# Patient Record
Sex: Female | Born: 1940 | Race: White | Hispanic: No | Marital: Married | Smoking: Never smoker
Health system: Southern US, Community
[De-identification: ages and names within clinical notes are randomized; demographics above are authoritative.]

## PROBLEM LIST (undated history)

## (undated) DIAGNOSIS — R911 Solitary pulmonary nodule: Secondary | ICD-10-CM

## (undated) DIAGNOSIS — J45909 Unspecified asthma, uncomplicated: Secondary | ICD-10-CM

## (undated) DIAGNOSIS — I10 Essential (primary) hypertension: Secondary | ICD-10-CM

## (undated) DIAGNOSIS — D509 Iron deficiency anemia, unspecified: Secondary | ICD-10-CM

## (undated) DIAGNOSIS — C801 Malignant (primary) neoplasm, unspecified: Secondary | ICD-10-CM

## (undated) DIAGNOSIS — E119 Type 2 diabetes mellitus without complications: Secondary | ICD-10-CM

## (undated) HISTORY — DX: Iron deficiency anemia, unspecified: D50.9

## (undated) HISTORY — PX: OOPHORECTOMY: SHX86

---

## 2004-05-09 ENCOUNTER — Ambulatory Visit: Payer: Self-pay | Admitting: Internal Medicine

## 2004-05-18 ENCOUNTER — Ambulatory Visit: Payer: Self-pay | Admitting: Internal Medicine

## 2005-05-11 ENCOUNTER — Ambulatory Visit: Payer: Self-pay | Admitting: Internal Medicine

## 2005-05-30 ENCOUNTER — Ambulatory Visit: Payer: Self-pay | Admitting: Internal Medicine

## 2005-12-01 ENCOUNTER — Ambulatory Visit: Payer: Self-pay | Admitting: Internal Medicine

## 2006-05-31 ENCOUNTER — Ambulatory Visit: Payer: Self-pay | Admitting: Internal Medicine

## 2007-06-27 ENCOUNTER — Ambulatory Visit: Payer: Self-pay | Admitting: Internal Medicine

## 2008-07-08 ENCOUNTER — Ambulatory Visit: Payer: Self-pay | Admitting: Internal Medicine

## 2009-07-12 ENCOUNTER — Ambulatory Visit: Payer: Self-pay | Admitting: Internal Medicine

## 2010-05-17 ENCOUNTER — Encounter
Admission: RE | Admit: 2010-05-17 | Discharge: 2010-05-17 | Payer: Self-pay | Source: Home / Self Care | Attending: Orthopedic Surgery | Admitting: Orthopedic Surgery

## 2010-05-18 ENCOUNTER — Encounter
Admission: RE | Admit: 2010-05-18 | Discharge: 2010-05-18 | Payer: Self-pay | Source: Home / Self Care | Attending: Orthopedic Surgery | Admitting: Orthopedic Surgery

## 2010-06-07 ENCOUNTER — Encounter
Admission: RE | Admit: 2010-06-07 | Discharge: 2010-06-07 | Payer: Self-pay | Source: Home / Self Care | Attending: Orthopedic Surgery | Admitting: Orthopedic Surgery

## 2010-07-20 ENCOUNTER — Ambulatory Visit: Payer: Self-pay | Admitting: Internal Medicine

## 2010-09-26 ENCOUNTER — Other Ambulatory Visit: Payer: Self-pay | Admitting: Orthopedic Surgery

## 2010-09-26 DIAGNOSIS — M79606 Pain in leg, unspecified: Secondary | ICD-10-CM

## 2010-09-28 ENCOUNTER — Ambulatory Visit
Admission: RE | Admit: 2010-09-28 | Discharge: 2010-09-28 | Disposition: A | Discharge status: Home / Self Care | Payer: Medicare Other | Source: Ambulatory Visit | Attending: Orthopedic Surgery | Admitting: Orthopedic Surgery

## 2010-09-28 DIAGNOSIS — M79606 Pain in leg, unspecified: Secondary | ICD-10-CM

## 2010-12-05 ENCOUNTER — Other Ambulatory Visit: Payer: Self-pay | Admitting: Physician Assistant

## 2010-12-05 DIAGNOSIS — M4800 Spinal stenosis, site unspecified: Secondary | ICD-10-CM

## 2010-12-06 ENCOUNTER — Ambulatory Visit
Admission: RE | Admit: 2010-12-06 | Discharge: 2010-12-06 | Disposition: A | Discharge status: Home / Self Care | Payer: Medicare Other | Source: Ambulatory Visit | Attending: Physician Assistant | Admitting: Physician Assistant

## 2010-12-06 DIAGNOSIS — M4800 Spinal stenosis, site unspecified: Secondary | ICD-10-CM

## 2011-01-23 ENCOUNTER — Other Ambulatory Visit: Payer: Self-pay | Admitting: Physician Assistant

## 2011-01-23 DIAGNOSIS — M549 Dorsalgia, unspecified: Secondary | ICD-10-CM

## 2011-01-24 ENCOUNTER — Ambulatory Visit
Admission: RE | Admit: 2011-01-24 | Discharge: 2011-01-24 | Disposition: A | Discharge status: Home / Self Care | Payer: Medicare Other | Source: Ambulatory Visit | Attending: Physician Assistant | Admitting: Physician Assistant

## 2011-01-24 DIAGNOSIS — M549 Dorsalgia, unspecified: Secondary | ICD-10-CM

## 2011-01-24 MED ORDER — IOHEXOL 180 MG/ML  SOLN
1.0000 mL | Freq: Once | INTRAMUSCULAR | Status: AC | PRN
  Administered 2011-01-24: 1 mL via EPIDURAL

## 2011-01-24 MED ORDER — METHYLPREDNISOLONE ACETATE 40 MG/ML INJ SUSP (RADIOLOG
120.0000 mg | Freq: Once | INTRAMUSCULAR | Status: AC
  Administered 2011-01-24: 120 mg via EPIDURAL

## 2011-03-31 ENCOUNTER — Other Ambulatory Visit: Payer: Self-pay | Admitting: Physician Assistant

## 2011-03-31 DIAGNOSIS — M549 Dorsalgia, unspecified: Secondary | ICD-10-CM

## 2011-04-03 ENCOUNTER — Ambulatory Visit
Admission: RE | Admit: 2011-04-03 | Discharge: 2011-04-03 | Disposition: A | Discharge status: Home / Self Care | Payer: Medicare Other | Source: Ambulatory Visit | Attending: Physician Assistant | Admitting: Physician Assistant

## 2011-04-03 DIAGNOSIS — M549 Dorsalgia, unspecified: Secondary | ICD-10-CM

## 2011-04-03 MED ORDER — IOHEXOL 180 MG/ML  SOLN
1.0000 mL | Freq: Once | INTRAMUSCULAR | Status: AC | PRN
  Administered 2011-04-03: 1 mL via EPIDURAL

## 2011-04-03 MED ORDER — METHYLPREDNISOLONE ACETATE 40 MG/ML INJ SUSP (RADIOLOG
120.0000 mg | Freq: Once | INTRAMUSCULAR | Status: AC
  Administered 2011-04-03: 120 mg via EPIDURAL

## 2011-06-23 ENCOUNTER — Other Ambulatory Visit: Payer: Self-pay | Admitting: Physician Assistant

## 2011-06-23 DIAGNOSIS — M4800 Spinal stenosis, site unspecified: Secondary | ICD-10-CM

## 2011-07-06 ENCOUNTER — Ambulatory Visit
Admission: RE | Admit: 2011-07-06 | Discharge: 2011-07-06 | Disposition: A | Discharge status: Home / Self Care | Payer: Medicare Other | Source: Ambulatory Visit | Attending: Physician Assistant | Admitting: Physician Assistant

## 2011-07-06 DIAGNOSIS — M4800 Spinal stenosis, site unspecified: Secondary | ICD-10-CM

## 2011-07-24 ENCOUNTER — Ambulatory Visit: Payer: Self-pay | Admitting: Internal Medicine

## 2011-09-04 ENCOUNTER — Other Ambulatory Visit: Payer: Self-pay | Admitting: Physician Assistant

## 2011-09-04 DIAGNOSIS — M4800 Spinal stenosis, site unspecified: Secondary | ICD-10-CM

## 2011-09-12 ENCOUNTER — Ambulatory Visit
Admission: RE | Admit: 2011-09-12 | Discharge: 2011-09-12 | Disposition: A | Discharge status: Home / Self Care | Payer: Medicare Other | Source: Ambulatory Visit | Attending: Physician Assistant | Admitting: Physician Assistant

## 2011-09-12 VITALS — BP 119/61 | HR 104

## 2011-09-12 DIAGNOSIS — M4800 Spinal stenosis, site unspecified: Secondary | ICD-10-CM

## 2011-09-12 MED ORDER — METHYLPREDNISOLONE ACETATE 40 MG/ML INJ SUSP (RADIOLOG
120.0000 mg | Freq: Once | INTRAMUSCULAR | Status: AC
  Administered 2011-09-12: 120 mg via EPIDURAL

## 2011-09-12 MED ORDER — IOHEXOL 180 MG/ML  SOLN
1.0000 mL | Freq: Once | INTRAMUSCULAR | Status: AC | PRN
  Administered 2011-09-12: 1 mL via EPIDURAL

## 2011-10-30 ENCOUNTER — Other Ambulatory Visit: Payer: Self-pay | Admitting: Physician Assistant

## 2011-10-30 DIAGNOSIS — M4800 Spinal stenosis, site unspecified: Secondary | ICD-10-CM

## 2011-11-13 ENCOUNTER — Ambulatory Visit
Admission: RE | Admit: 2011-11-13 | Discharge: 2011-11-13 | Disposition: A | Discharge status: Home / Self Care | Payer: Medicare Other | Source: Ambulatory Visit | Attending: Physician Assistant | Admitting: Physician Assistant

## 2011-11-13 DIAGNOSIS — M4800 Spinal stenosis, site unspecified: Secondary | ICD-10-CM

## 2011-11-13 MED ORDER — METHYLPREDNISOLONE ACETATE 40 MG/ML INJ SUSP (RADIOLOG
120.0000 mg | Freq: Once | INTRAMUSCULAR | Status: AC
  Administered 2011-11-13: 120 mg via EPIDURAL

## 2011-11-13 MED ORDER — IOHEXOL 180 MG/ML  SOLN
1.0000 mL | Freq: Once | INTRAMUSCULAR | Status: AC | PRN
  Administered 2011-11-13: 1 mL via EPIDURAL

## 2011-12-20 ENCOUNTER — Other Ambulatory Visit: Payer: Self-pay | Admitting: Physician Assistant

## 2011-12-20 DIAGNOSIS — M4800 Spinal stenosis, site unspecified: Secondary | ICD-10-CM

## 2012-01-23 ENCOUNTER — Ambulatory Visit
Admission: RE | Admit: 2012-01-23 | Discharge: 2012-01-23 | Disposition: A | Discharge status: Home / Self Care | Payer: Medicare Other | Source: Ambulatory Visit | Attending: Physician Assistant | Admitting: Physician Assistant

## 2012-01-23 VITALS — BP 154/74 | HR 82

## 2012-01-23 DIAGNOSIS — M4800 Spinal stenosis, site unspecified: Secondary | ICD-10-CM

## 2012-01-23 MED ORDER — METHYLPREDNISOLONE ACETATE 40 MG/ML INJ SUSP (RADIOLOG
120.0000 mg | Freq: Once | INTRAMUSCULAR | Status: AC
  Administered 2012-01-23: 120 mg via EPIDURAL

## 2012-01-23 MED ORDER — IOHEXOL 180 MG/ML  SOLN
1.0000 mL | Freq: Once | INTRAMUSCULAR | Status: AC | PRN
  Administered 2012-01-23: 1 mL via EPIDURAL

## 2012-01-24 ENCOUNTER — Telehealth: Payer: Self-pay | Admitting: Radiology

## 2012-04-30 ENCOUNTER — Other Ambulatory Visit: Payer: Self-pay | Admitting: Physician Assistant

## 2012-04-30 DIAGNOSIS — M549 Dorsalgia, unspecified: Secondary | ICD-10-CM

## 2012-05-01 ENCOUNTER — Ambulatory Visit
Admission: RE | Admit: 2012-05-01 | Discharge: 2012-05-01 | Disposition: A | Discharge status: Home / Self Care | Payer: Medicare Other | Source: Ambulatory Visit | Attending: Physician Assistant | Admitting: Physician Assistant

## 2012-05-01 VITALS — BP 190/75 | HR 70

## 2012-05-01 DIAGNOSIS — M549 Dorsalgia, unspecified: Secondary | ICD-10-CM

## 2012-05-01 MED ORDER — METHYLPREDNISOLONE ACETATE 40 MG/ML INJ SUSP (RADIOLOG
120.0000 mg | Freq: Once | INTRAMUSCULAR | Status: AC
  Administered 2012-05-01: 120 mg via EPIDURAL

## 2012-05-01 MED ORDER — IOHEXOL 180 MG/ML  SOLN
1.0000 mL | Freq: Once | INTRAMUSCULAR | Status: AC | PRN
  Administered 2012-05-01: 1 mL via EPIDURAL

## 2012-07-24 ENCOUNTER — Ambulatory Visit: Payer: Self-pay | Admitting: Internal Medicine

## 2012-08-14 ENCOUNTER — Other Ambulatory Visit: Payer: Self-pay | Admitting: Physician Assistant

## 2012-08-14 DIAGNOSIS — M4800 Spinal stenosis, site unspecified: Secondary | ICD-10-CM

## 2012-08-16 ENCOUNTER — Ambulatory Visit
Admission: RE | Admit: 2012-08-16 | Discharge: 2012-08-16 | Disposition: A | Discharge status: Home / Self Care | Payer: 59 | Source: Ambulatory Visit | Attending: Physician Assistant | Admitting: Physician Assistant

## 2012-08-16 DIAGNOSIS — M4800 Spinal stenosis, site unspecified: Secondary | ICD-10-CM

## 2012-08-16 MED ORDER — IOHEXOL 180 MG/ML  SOLN
1.0000 mL | Freq: Once | INTRAMUSCULAR | Status: AC | PRN
  Administered 2012-08-16: 1 mL via EPIDURAL

## 2012-08-16 MED ORDER — METHYLPREDNISOLONE ACETATE 40 MG/ML INJ SUSP (RADIOLOG
120.0000 mg | Freq: Once | INTRAMUSCULAR | Status: AC
  Administered 2012-08-16: 120 mg via EPIDURAL

## 2012-10-16 ENCOUNTER — Other Ambulatory Visit: Payer: Self-pay | Admitting: Physician Assistant

## 2012-10-16 DIAGNOSIS — M549 Dorsalgia, unspecified: Secondary | ICD-10-CM

## 2012-11-05 ENCOUNTER — Other Ambulatory Visit: Payer: 59

## 2012-11-05 ENCOUNTER — Ambulatory Visit
Admission: RE | Admit: 2012-11-05 | Discharge: 2012-11-05 | Disposition: A | Discharge status: Home / Self Care | Payer: 59 | Source: Ambulatory Visit | Attending: Physician Assistant | Admitting: Physician Assistant

## 2012-11-05 VITALS — BP 152/73 | HR 87

## 2012-11-05 DIAGNOSIS — M549 Dorsalgia, unspecified: Secondary | ICD-10-CM

## 2012-11-05 MED ORDER — IOHEXOL 180 MG/ML  SOLN
1.0000 mL | Freq: Once | INTRAMUSCULAR | Status: AC | PRN
  Administered 2012-11-05: 1 mL via EPIDURAL

## 2012-11-05 MED ORDER — METHYLPREDNISOLONE ACETATE 40 MG/ML INJ SUSP (RADIOLOG
120.0000 mg | Freq: Once | INTRAMUSCULAR | Status: AC
  Administered 2012-11-05: 120 mg via EPIDURAL

## 2013-01-24 ENCOUNTER — Other Ambulatory Visit: Payer: Self-pay | Admitting: Physician Assistant

## 2013-01-24 DIAGNOSIS — M4800 Spinal stenosis, site unspecified: Secondary | ICD-10-CM

## 2013-02-04 ENCOUNTER — Ambulatory Visit
Admission: RE | Admit: 2013-02-04 | Discharge: 2013-02-04 | Disposition: A | Discharge status: Home / Self Care | Payer: 59 | Source: Ambulatory Visit | Attending: Physician Assistant | Admitting: Physician Assistant

## 2013-02-04 VITALS — BP 186/69 | HR 99

## 2013-02-04 DIAGNOSIS — M4800 Spinal stenosis, site unspecified: Secondary | ICD-10-CM

## 2013-02-04 MED ORDER — METHYLPREDNISOLONE ACETATE 40 MG/ML INJ SUSP (RADIOLOG
120.0000 mg | Freq: Once | INTRAMUSCULAR | Status: AC
  Administered 2013-02-04: 120 mg via EPIDURAL

## 2013-02-04 MED ORDER — IOHEXOL 180 MG/ML  SOLN
1.0000 mL | Freq: Once | INTRAMUSCULAR | Status: AC | PRN
  Administered 2013-02-04: 1 mL via EPIDURAL

## 2013-03-31 ENCOUNTER — Other Ambulatory Visit: Payer: Self-pay | Admitting: Physician Assistant

## 2013-03-31 DIAGNOSIS — M549 Dorsalgia, unspecified: Secondary | ICD-10-CM

## 2013-04-08 ENCOUNTER — Ambulatory Visit
Admission: RE | Admit: 2013-04-08 | Discharge: 2013-04-08 | Disposition: A | Discharge status: Home / Self Care | Payer: 59 | Source: Ambulatory Visit | Attending: Physician Assistant | Admitting: Physician Assistant

## 2013-04-08 DIAGNOSIS — M549 Dorsalgia, unspecified: Secondary | ICD-10-CM

## 2013-04-08 MED ORDER — METHYLPREDNISOLONE ACETATE 40 MG/ML INJ SUSP (RADIOLOG
120.0000 mg | Freq: Once | INTRAMUSCULAR | Status: AC
  Administered 2013-04-08: 120 mg via EPIDURAL

## 2013-04-08 MED ORDER — IOHEXOL 180 MG/ML  SOLN
1.0000 mL | Freq: Once | INTRAMUSCULAR | Status: AC | PRN
  Administered 2013-04-08: 1 mL via EPIDURAL

## 2013-05-26 ENCOUNTER — Other Ambulatory Visit: Payer: Self-pay | Admitting: Physician Assistant

## 2013-05-26 DIAGNOSIS — M549 Dorsalgia, unspecified: Secondary | ICD-10-CM

## 2013-05-27 ENCOUNTER — Ambulatory Visit
Admission: RE | Admit: 2013-05-27 | Discharge: 2013-05-27 | Disposition: A | Discharge status: Home / Self Care | Payer: 59 | Source: Ambulatory Visit | Attending: Physician Assistant | Admitting: Physician Assistant

## 2013-05-27 DIAGNOSIS — M549 Dorsalgia, unspecified: Secondary | ICD-10-CM

## 2013-05-27 MED ORDER — IOHEXOL 180 MG/ML  SOLN
1.0000 mL | Freq: Once | INTRAMUSCULAR | Status: AC | PRN
  Administered 2013-05-27: 1 mL via EPIDURAL

## 2013-05-27 MED ORDER — METHYLPREDNISOLONE ACETATE 40 MG/ML INJ SUSP (RADIOLOG
120.0000 mg | Freq: Once | INTRAMUSCULAR | Status: AC
  Administered 2013-05-27: 120 mg via EPIDURAL

## 2013-08-04 ENCOUNTER — Ambulatory Visit: Payer: Self-pay | Admitting: Internal Medicine

## 2013-08-11 ENCOUNTER — Ambulatory Visit: Payer: Self-pay | Admitting: Internal Medicine

## 2013-08-18 ENCOUNTER — Other Ambulatory Visit: Payer: Self-pay | Admitting: Internal Medicine

## 2013-08-18 DIAGNOSIS — M549 Dorsalgia, unspecified: Secondary | ICD-10-CM

## 2013-08-25 ENCOUNTER — Ambulatory Visit
Admission: RE | Admit: 2013-08-25 | Discharge: 2013-08-25 | Disposition: A | Discharge status: Home / Self Care | Payer: 59 | Source: Ambulatory Visit | Attending: Internal Medicine | Admitting: Internal Medicine

## 2013-08-25 DIAGNOSIS — M549 Dorsalgia, unspecified: Secondary | ICD-10-CM

## 2013-08-25 MED ORDER — IOHEXOL 180 MG/ML  SOLN
1.0000 mL | Freq: Once | INTRAMUSCULAR | Status: AC | PRN
  Administered 2013-08-25: 1 mL via EPIDURAL

## 2013-08-25 MED ORDER — METHYLPREDNISOLONE ACETATE 40 MG/ML INJ SUSP (RADIOLOG
120.0000 mg | Freq: Once | INTRAMUSCULAR | Status: AC
  Administered 2013-08-25: 120 mg via EPIDURAL

## 2013-10-13 ENCOUNTER — Other Ambulatory Visit: Payer: Self-pay | Admitting: Internal Medicine

## 2013-10-13 DIAGNOSIS — M545 Low back pain, unspecified: Secondary | ICD-10-CM

## 2013-10-28 ENCOUNTER — Ambulatory Visit
Admission: RE | Admit: 2013-10-28 | Discharge: 2013-10-28 | Disposition: A | Discharge status: Home / Self Care | Payer: 59 | Source: Ambulatory Visit | Attending: Internal Medicine | Admitting: Internal Medicine

## 2013-10-28 DIAGNOSIS — M545 Low back pain, unspecified: Secondary | ICD-10-CM

## 2013-10-28 MED ORDER — IOHEXOL 180 MG/ML  SOLN
1.0000 mL | Freq: Once | INTRAMUSCULAR | Status: AC | PRN
Start: 2013-10-28 — End: 2013-10-28
  Administered 2013-10-28: 1 mL via EPIDURAL

## 2013-10-28 MED ORDER — METHYLPREDNISOLONE ACETATE 40 MG/ML INJ SUSP (RADIOLOG
120.0000 mg | Freq: Once | INTRAMUSCULAR | Status: AC
  Administered 2013-10-28: 120 mg via EPIDURAL

## 2013-12-31 ENCOUNTER — Other Ambulatory Visit: Payer: Self-pay | Admitting: Internal Medicine

## 2013-12-31 DIAGNOSIS — E119 Type 2 diabetes mellitus without complications: Secondary | ICD-10-CM | POA: Insufficient documentation

## 2013-12-31 DIAGNOSIS — M545 Low back pain, unspecified: Secondary | ICD-10-CM

## 2013-12-31 DIAGNOSIS — J45909 Unspecified asthma, uncomplicated: Secondary | ICD-10-CM | POA: Insufficient documentation

## 2013-12-31 DIAGNOSIS — M4800 Spinal stenosis, site unspecified: Secondary | ICD-10-CM | POA: Insufficient documentation

## 2013-12-31 DIAGNOSIS — E785 Hyperlipidemia, unspecified: Secondary | ICD-10-CM | POA: Insufficient documentation

## 2013-12-31 DIAGNOSIS — E669 Obesity, unspecified: Secondary | ICD-10-CM | POA: Insufficient documentation

## 2013-12-31 DIAGNOSIS — I10 Essential (primary) hypertension: Secondary | ICD-10-CM | POA: Insufficient documentation

## 2014-01-09 ENCOUNTER — Ambulatory Visit
Admission: RE | Admit: 2014-01-09 | Discharge: 2014-01-09 | Disposition: A | Discharge status: Home / Self Care | Payer: 59 | Source: Ambulatory Visit | Attending: Internal Medicine | Admitting: Internal Medicine

## 2014-01-09 DIAGNOSIS — M545 Low back pain, unspecified: Secondary | ICD-10-CM

## 2014-01-12 ENCOUNTER — Other Ambulatory Visit: Payer: Self-pay | Admitting: Internal Medicine

## 2014-01-12 DIAGNOSIS — G8929 Other chronic pain: Secondary | ICD-10-CM

## 2014-01-12 DIAGNOSIS — M545 Low back pain: Principal | ICD-10-CM

## 2014-01-21 ENCOUNTER — Ambulatory Visit
Admission: RE | Admit: 2014-01-21 | Discharge: 2014-01-21 | Disposition: A | Discharge status: Home / Self Care | Payer: 59 | Source: Ambulatory Visit | Attending: Internal Medicine | Admitting: Internal Medicine

## 2014-01-21 DIAGNOSIS — G8929 Other chronic pain: Secondary | ICD-10-CM

## 2014-01-21 DIAGNOSIS — M545 Low back pain: Principal | ICD-10-CM

## 2014-01-21 MED ORDER — METHYLPREDNISOLONE ACETATE 40 MG/ML INJ SUSP (RADIOLOG
120.0000 mg | Freq: Once | INTRAMUSCULAR | Status: AC
  Administered 2014-01-21: 120 mg via EPIDURAL

## 2014-01-21 MED ORDER — IOHEXOL 180 MG/ML  SOLN
1.0000 mL | Freq: Once | INTRAMUSCULAR | Status: AC | PRN
  Administered 2014-01-21: 1 mL via EPIDURAL

## 2014-03-12 ENCOUNTER — Other Ambulatory Visit: Payer: Self-pay | Admitting: Internal Medicine

## 2014-03-12 DIAGNOSIS — G8929 Other chronic pain: Secondary | ICD-10-CM

## 2014-03-12 DIAGNOSIS — M545 Low back pain: Principal | ICD-10-CM

## 2014-03-25 ENCOUNTER — Ambulatory Visit
Admission: RE | Admit: 2014-03-25 | Discharge: 2014-03-25 | Disposition: A | Discharge status: Home / Self Care | Payer: 59 | Source: Ambulatory Visit | Attending: Internal Medicine | Admitting: Internal Medicine

## 2014-03-25 DIAGNOSIS — G8929 Other chronic pain: Secondary | ICD-10-CM

## 2014-03-25 DIAGNOSIS — M545 Low back pain, unspecified: Secondary | ICD-10-CM

## 2014-03-25 MED ORDER — METHYLPREDNISOLONE ACETATE 40 MG/ML INJ SUSP (RADIOLOG
120.0000 mg | Freq: Once | INTRAMUSCULAR | Status: AC
  Administered 2014-03-25: 120 mg via EPIDURAL

## 2014-03-25 MED ORDER — IOHEXOL 180 MG/ML  SOLN
1.0000 mL | Freq: Once | INTRAMUSCULAR | Status: AC | PRN
  Administered 2014-03-25: 1 mL via EPIDURAL

## 2014-06-01 ENCOUNTER — Other Ambulatory Visit: Payer: Self-pay | Admitting: Internal Medicine

## 2014-06-01 DIAGNOSIS — G8929 Other chronic pain: Secondary | ICD-10-CM

## 2014-06-01 DIAGNOSIS — M545 Low back pain, unspecified: Secondary | ICD-10-CM

## 2014-06-08 ENCOUNTER — Other Ambulatory Visit: Payer: Self-pay

## 2014-06-12 ENCOUNTER — Ambulatory Visit
Admission: RE | Admit: 2014-06-12 | Discharge: 2014-06-12 | Disposition: A | Discharge status: Home / Self Care | Payer: Medicare PPO | Source: Ambulatory Visit | Attending: Internal Medicine | Admitting: Internal Medicine

## 2014-06-12 DIAGNOSIS — M545 Low back pain: Principal | ICD-10-CM

## 2014-06-12 DIAGNOSIS — G8929 Other chronic pain: Secondary | ICD-10-CM

## 2014-06-12 MED ORDER — IOHEXOL 180 MG/ML  SOLN
1.0000 mL | Freq: Once | INTRAMUSCULAR | Status: AC | PRN
  Administered 2014-06-12: 1 mL via EPIDURAL

## 2014-06-12 MED ORDER — METHYLPREDNISOLONE ACETATE 40 MG/ML INJ SUSP (RADIOLOG
120.0000 mg | Freq: Once | INTRAMUSCULAR | Status: AC
  Administered 2014-06-12: 120 mg via EPIDURAL

## 2014-08-18 ENCOUNTER — Other Ambulatory Visit: Payer: Self-pay | Admitting: Internal Medicine

## 2014-08-18 DIAGNOSIS — M545 Low back pain: Principal | ICD-10-CM

## 2014-08-18 DIAGNOSIS — G8929 Other chronic pain: Secondary | ICD-10-CM

## 2014-09-01 ENCOUNTER — Ambulatory Visit
Admission: RE | Admit: 2014-09-01 | Discharge: 2014-09-01 | Disposition: A | Discharge status: Home / Self Care | Payer: Medicare PPO | Source: Ambulatory Visit | Attending: Internal Medicine | Admitting: Internal Medicine

## 2014-09-01 DIAGNOSIS — M545 Low back pain, unspecified: Secondary | ICD-10-CM

## 2014-09-01 DIAGNOSIS — G8929 Other chronic pain: Secondary | ICD-10-CM

## 2014-09-01 MED ORDER — METHYLPREDNISOLONE ACETATE 40 MG/ML INJ SUSP (RADIOLOG
120.0000 mg | Freq: Once | INTRAMUSCULAR | Status: AC
  Administered 2014-09-01: 120 mg via EPIDURAL

## 2014-09-01 MED ORDER — IOHEXOL 180 MG/ML  SOLN
1.0000 mL | Freq: Once | INTRAMUSCULAR | Status: AC | PRN
  Administered 2014-09-01: 1 mL via EPIDURAL

## 2014-10-12 ENCOUNTER — Other Ambulatory Visit: Payer: Self-pay | Admitting: Internal Medicine

## 2014-10-12 DIAGNOSIS — G8929 Other chronic pain: Secondary | ICD-10-CM

## 2014-10-12 DIAGNOSIS — M545 Low back pain: Principal | ICD-10-CM

## 2014-10-29 ENCOUNTER — Ambulatory Visit
Admission: RE | Admit: 2014-10-29 | Discharge: 2014-10-29 | Disposition: A | Discharge status: Home / Self Care | Payer: Medicare PPO | Source: Ambulatory Visit | Attending: Internal Medicine | Admitting: Internal Medicine

## 2014-10-29 DIAGNOSIS — G8929 Other chronic pain: Secondary | ICD-10-CM

## 2014-10-29 DIAGNOSIS — M545 Low back pain: Principal | ICD-10-CM

## 2014-10-29 MED ORDER — IOHEXOL 180 MG/ML  SOLN
1.0000 mL | Freq: Once | INTRAMUSCULAR | Status: AC | PRN
  Administered 2014-10-29: 1 mL via EPIDURAL

## 2014-10-29 MED ORDER — METHYLPREDNISOLONE ACETATE 40 MG/ML INJ SUSP (RADIOLOG
120.0000 mg | Freq: Once | INTRAMUSCULAR | Status: AC
  Administered 2014-10-29: 120 mg via EPIDURAL

## 2014-12-29 ENCOUNTER — Other Ambulatory Visit: Payer: Self-pay | Admitting: Internal Medicine

## 2014-12-29 DIAGNOSIS — G8929 Other chronic pain: Secondary | ICD-10-CM

## 2014-12-29 DIAGNOSIS — M545 Low back pain: Principal | ICD-10-CM

## 2015-01-05 ENCOUNTER — Ambulatory Visit
Admission: RE | Admit: 2015-01-05 | Discharge: 2015-01-05 | Disposition: A | Discharge status: Home / Self Care | Payer: Medicare PPO | Source: Ambulatory Visit | Attending: Internal Medicine | Admitting: Internal Medicine

## 2015-01-05 DIAGNOSIS — M545 Low back pain: Principal | ICD-10-CM

## 2015-01-05 DIAGNOSIS — G8929 Other chronic pain: Secondary | ICD-10-CM

## 2015-01-05 MED ORDER — METHYLPREDNISOLONE ACETATE 40 MG/ML INJ SUSP (RADIOLOG
120.0000 mg | Freq: Once | INTRAMUSCULAR | Status: AC
  Administered 2015-01-05: 120 mg via EPIDURAL

## 2015-01-05 MED ORDER — IOHEXOL 180 MG/ML  SOLN
1.0000 mL | Freq: Once | INTRAMUSCULAR | Status: DC | PRN
  Administered 2015-01-05: 1 mL via EPIDURAL

## 2015-01-28 LAB — HM DIABETES EYE EXAM

## 2015-02-25 ENCOUNTER — Other Ambulatory Visit: Payer: Self-pay | Admitting: Internal Medicine

## 2015-02-25 DIAGNOSIS — G8929 Other chronic pain: Secondary | ICD-10-CM

## 2015-02-25 DIAGNOSIS — M545 Low back pain, unspecified: Secondary | ICD-10-CM

## 2015-03-09 ENCOUNTER — Ambulatory Visit
Admission: RE | Admit: 2015-03-09 | Discharge: 2015-03-09 | Disposition: A | Discharge status: Home / Self Care | Payer: Medicare PPO | Source: Ambulatory Visit | Attending: Internal Medicine | Admitting: Internal Medicine

## 2015-03-09 DIAGNOSIS — M545 Low back pain, unspecified: Secondary | ICD-10-CM

## 2015-03-09 DIAGNOSIS — G8929 Other chronic pain: Secondary | ICD-10-CM

## 2015-03-09 MED ORDER — METHYLPREDNISOLONE ACETATE 40 MG/ML INJ SUSP (RADIOLOG
120.0000 mg | Freq: Once | INTRAMUSCULAR | Status: AC
  Administered 2015-03-09: 120 mg via EPIDURAL

## 2015-03-09 MED ORDER — IOHEXOL 180 MG/ML  SOLN
1.0000 mL | Freq: Once | INTRAMUSCULAR | Status: DC | PRN
  Administered 2015-03-09: 1 mL via EPIDURAL

## 2015-04-21 ENCOUNTER — Other Ambulatory Visit: Payer: Self-pay | Admitting: Internal Medicine

## 2015-04-21 DIAGNOSIS — G8929 Other chronic pain: Secondary | ICD-10-CM

## 2015-04-21 DIAGNOSIS — M545 Low back pain, unspecified: Secondary | ICD-10-CM

## 2015-05-06 ENCOUNTER — Ambulatory Visit
Admission: RE | Admit: 2015-05-06 | Discharge: 2015-05-06 | Disposition: A | Discharge status: Home / Self Care | Payer: Medicare PPO | Source: Ambulatory Visit | Attending: Internal Medicine | Admitting: Internal Medicine

## 2015-05-06 DIAGNOSIS — M545 Low back pain: Principal | ICD-10-CM

## 2015-05-06 DIAGNOSIS — G8929 Other chronic pain: Secondary | ICD-10-CM

## 2015-05-06 MED ORDER — METHYLPREDNISOLONE ACETATE 40 MG/ML INJ SUSP (RADIOLOG
120.0000 mg | Freq: Once | INTRAMUSCULAR | Status: AC
Start: 2015-05-06 — End: 2015-05-06
  Administered 2015-05-06: 120 mg via EPIDURAL

## 2015-05-06 MED ORDER — IOHEXOL 180 MG/ML  SOLN
1.0000 mL | Freq: Once | INTRAMUSCULAR | Status: AC | PRN
  Administered 2015-05-06: 1 mL via EPIDURAL

## 2015-08-12 ENCOUNTER — Other Ambulatory Visit: Payer: Self-pay | Admitting: Internal Medicine

## 2015-08-12 DIAGNOSIS — M545 Low back pain: Principal | ICD-10-CM

## 2015-08-12 DIAGNOSIS — G8929 Other chronic pain: Secondary | ICD-10-CM

## 2015-08-20 ENCOUNTER — Ambulatory Visit
Admission: RE | Admit: 2015-08-20 | Discharge: 2015-08-20 | Disposition: A | Discharge status: Home / Self Care | Payer: Medicare PPO | Source: Ambulatory Visit | Attending: Internal Medicine | Admitting: Internal Medicine

## 2015-08-20 DIAGNOSIS — G8929 Other chronic pain: Secondary | ICD-10-CM

## 2015-08-20 DIAGNOSIS — M545 Low back pain: Principal | ICD-10-CM

## 2015-08-20 MED ORDER — METHYLPREDNISOLONE ACETATE 40 MG/ML INJ SUSP (RADIOLOG
120.0000 mg | Freq: Once | INTRAMUSCULAR | Status: AC
  Administered 2015-08-20: 120 mg via EPIDURAL

## 2015-08-20 MED ORDER — IOHEXOL 180 MG/ML  SOLN
1.0000 mL | Freq: Once | INTRAMUSCULAR | Status: AC | PRN
  Administered 2015-08-20: 1 mL via EPIDURAL

## 2015-10-26 ENCOUNTER — Other Ambulatory Visit: Payer: Self-pay | Admitting: Internal Medicine

## 2015-10-26 DIAGNOSIS — G8929 Other chronic pain: Secondary | ICD-10-CM

## 2015-10-26 DIAGNOSIS — M545 Low back pain, unspecified: Secondary | ICD-10-CM

## 2015-11-04 ENCOUNTER — Ambulatory Visit
Admission: RE | Admit: 2015-11-04 | Discharge: 2015-11-04 | Disposition: A | Discharge status: Home / Self Care | Payer: Medicare PPO | Source: Ambulatory Visit | Attending: Internal Medicine | Admitting: Internal Medicine

## 2015-11-04 DIAGNOSIS — M545 Low back pain, unspecified: Secondary | ICD-10-CM

## 2015-11-04 DIAGNOSIS — G8929 Other chronic pain: Secondary | ICD-10-CM

## 2015-11-04 MED ORDER — IOPAMIDOL (ISOVUE-M 200) INJECTION 41%
10.0000 mL | Freq: Once | INTRAMUSCULAR | Status: AC
  Administered 2015-11-04: 10 mL via EPIDURAL

## 2015-11-04 MED ORDER — METHYLPREDNISOLONE ACETATE 40 MG/ML INJ SUSP (RADIOLOG
120.0000 mg | Freq: Once | INTRAMUSCULAR | Status: AC
  Administered 2015-11-04: 120 mg via EPIDURAL

## 2015-11-15 ENCOUNTER — Other Ambulatory Visit: Payer: Self-pay | Admitting: Internal Medicine

## 2015-11-15 DIAGNOSIS — Z1231 Encounter for screening mammogram for malignant neoplasm of breast: Secondary | ICD-10-CM

## 2015-12-03 ENCOUNTER — Other Ambulatory Visit: Payer: Self-pay | Admitting: Internal Medicine

## 2015-12-03 ENCOUNTER — Ambulatory Visit
Admission: RE | Admit: 2015-12-03 | Discharge: 2015-12-03 | Disposition: A | Discharge status: Home / Self Care | Payer: Medicare PPO | Source: Ambulatory Visit | Attending: Internal Medicine | Admitting: Internal Medicine

## 2015-12-03 DIAGNOSIS — Z1231 Encounter for screening mammogram for malignant neoplasm of breast: Secondary | ICD-10-CM | POA: Insufficient documentation

## 2017-05-02 DIAGNOSIS — D510 Vitamin B12 deficiency anemia due to intrinsic factor deficiency: Secondary | ICD-10-CM | POA: Insufficient documentation

## 2017-07-24 ENCOUNTER — Other Ambulatory Visit: Payer: Self-pay | Admitting: Internal Medicine

## 2017-07-24 ENCOUNTER — Ambulatory Visit
Admission: RE | Admit: 2017-07-24 | Discharge: 2017-07-24 | Disposition: A | Discharge status: Home / Self Care | Payer: Medicare PPO | Source: Ambulatory Visit | Attending: Internal Medicine | Admitting: Internal Medicine

## 2017-07-24 DIAGNOSIS — R590 Localized enlarged lymph nodes: Secondary | ICD-10-CM | POA: Insufficient documentation

## 2017-07-24 DIAGNOSIS — K8020 Calculus of gallbladder without cholecystitis without obstruction: Secondary | ICD-10-CM | POA: Diagnosis not present

## 2017-07-24 DIAGNOSIS — R918 Other nonspecific abnormal finding of lung field: Secondary | ICD-10-CM | POA: Diagnosis present

## 2017-07-24 DIAGNOSIS — E279 Disorder of adrenal gland, unspecified: Secondary | ICD-10-CM | POA: Diagnosis not present

## 2017-07-24 HISTORY — DX: Essential (primary) hypertension: I10

## 2017-07-24 LAB — POCT I-STAT CREATININE: CREATININE: 1.2 mg/dL — AB (ref 0.44–1.00)

## 2017-07-24 MED ORDER — IOPAMIDOL (ISOVUE-300) INJECTION 61%
75.0000 mL | Freq: Once | INTRAVENOUS | Status: AC | PRN
  Administered 2017-07-24: 75 mL via INTRAVENOUS

## 2017-07-25 ENCOUNTER — Ambulatory Visit (INDEPENDENT_AMBULATORY_CARE_PROVIDER_SITE_OTHER): Payer: Medicare PPO | Admitting: Internal Medicine

## 2017-07-25 ENCOUNTER — Telehealth: Payer: Self-pay | Admitting: *Deleted

## 2017-07-25 ENCOUNTER — Encounter: Payer: Self-pay | Admitting: *Deleted

## 2017-07-25 ENCOUNTER — Encounter: Payer: Self-pay | Admitting: Internal Medicine

## 2017-07-25 VITALS — BP 112/70 | HR 78 | Resp 16 | Ht 69.0 in | Wt 290.0 lb

## 2017-07-25 DIAGNOSIS — R918 Other nonspecific abnormal finding of lung field: Secondary | ICD-10-CM

## 2017-07-25 NOTE — Patient Instructions (Signed)
You can eat today, the procedure is scheduled for tomorrow.  Do not take aspirin until the day after the procedure.

## 2017-07-25 NOTE — Progress Notes (Signed)
[facility] Consultation      Assessment and Plan:  Lung mass-right hilar with smaller right peripheral lung mass - Insidious onset back pain with incidental finding of large right lung mass.  Patient has no respiratory history, she denies any dyspnea or other respiratory complaints.  She denies any symptoms suggestive of SVC syndrome. - We will plan for bronchoscopic biopsy Dr. Jamal Collin will be performing the bronchoscopy tomorrow. -Discussed findings and reviewed CT chest with patient.  Discussed risks, benefits of and alternatives to bronchoscopy.   Date: 07/25/2017  MRN# [id number] Carrie Marquez [birth year]  Referring Physician: Dr. Wynetta Emery.  Carrie Marquez is a 77 y.o. old female seen in consultation for chief complaint of:    Chief Complaint  Patient presents with  . Advice Only    referred by Dr. Amaryllis Dyke for eval of lung mass:  . Shortness of Breath    with activity  . Lung Mass    HPI:  Symptoms began with pain in her back about 3 weeks ago, she got muscle relaxer and steroids which helped but quickly wore off and the pain returned. Currenlty her pain is improved with hydrocodone.  She denies pain in the chest or swelling in the neck or face.  She has never smoked, her husband smoked about 30 years ago, her father smoked.  She worked in Conservation [employer], historic buildings.  She has spinal stenosis which makes it hard to walk.  She takes no blood thinners.  She has had arthroscopy on her left knee about 8 years ago.  No cardiac history, she has had irregular heart beat most of her life.  No sinus drainage, or reflux.   Imaging personally reviewed, CT chest 07/24/17, there is a large lobulated right paratracheal mass which appears to involve the SVC.  There is also another nodule in the superior aspect of the right lower lobe   PMHX:   Past Medical History:  Diagnosis Date  . Hypertension    Surgical Hx:  Past Surgical History:  Procedure Laterality Date  .  OOPHORECTOMY Right    Family Hx:  Family History  Problem Relation Age of Onset  . Breast cancer Mother        19's  . Breast cancer Maternal Aunt    Social Hx:   Social History   Tobacco Use  . Smoking status: Never Smoker  . Smokeless tobacco: Never Used  Substance Use Topics  . Alcohol use: Not on file  . Drug use: Not on file   Medication:    Current Outpatient Medications:  .  albuterol (VENTOLIN HFA) 108 (90 BASE) MCG/ACT inhaler, Inhale into the lungs., Disp: , Rfl:  .  aspirin EC 81 MG tablet, Take 81 mg by mouth daily., Disp: , Rfl:  .  carvedilol (COREG) 6.25 MG tablet, Take by mouth., Disp: , Rfl:  .  Cholecalciferol (VITAMIN D-1000 MAX ST) 1000 UNITS tablet, Take by mouth., Disp: , Rfl:  .  citalopram (CELEXA) 20 MG tablet, Take by mouth., Disp: , Rfl:  .  Cyanocobalamin (RA VITAMIN B-12 TR) 1000 MCG TBCR, Take by mouth., Disp: , Rfl:  .  fluticasone (FLONASE) 50 MCG/ACT nasal spray, Place into the nose., Disp: , Rfl:  .  folic acid (FOLVITE) 1 MG tablet, Take by mouth., Disp: , Rfl:  .  gabapentin (NEURONTIN) 300 MG capsule, Take 300 mg by mouth at bedtime., Disp: , Rfl:  .  HYDROcodone-acetaminophen (NORCO/VICODIN) 5-325 MG tablet, Take 1 tablet by  mouth every 6 (six) hours as needed., Disp: , Rfl:  .  LORazepam (ATIVAN) 0.5 MG tablet, Take by mouth., Disp: , Rfl:  .  losartan-hydrochlorothiazide (HYZAAR) 100-25 MG per tablet, Take by mouth., Disp: , Rfl:  .  meloxicam (MOBIC) 15 MG tablet, , Disp: , Rfl:  .  simvastatin (ZOCOR) 20 MG tablet, Take by mouth., Disp: , Rfl:    Allergies:  Sertaconazole and Statins  Review of Systems: Gen:  Denies  fever, sweats, chills HEENT: Denies blurred vision, double vision. bleeds, sore throat Cvc:  No dizziness, chest pain. Resp:   Denies cough or sputum production, shortness of breath Gi: Denies swallowing difficulty, stomach pain. Gu:  Denies bladder incontinence, burning urine Ext:   No  stiffness. Skin: No skin  rash,  hives  Endoc:  No polyuria, polydipsia. Psych: No depression, insomnia. Other:  All other systems were reviewed with the patient and were negative other that what is mentioned in the HPI.   Physical Examination:   VS: BP 112/70 (BP Location: Left Arm, Cuff Size: Large)   Pulse 78   Resp 16   Ht 5\' 9"  (1.753 m)   Wt 290 lb (131.5 kg) Comment: per patient she is in wheelchair decline wt.  SpO2 95%   BMI 42.83 kg/m   General Appearance: No distress  Neuro:without focal findings,  speech normal,  HEENT: PERRLA, EOM intact.   Pulmonary: normal breath sounds, No wheezing.  CardiovascularNormal S1,S2.  No m/r/g.   Abdomen: Benign, Soft, non-tender. Renal:  No costovertebral tenderness  GU:  No performed at this time. Endoc: No evident thyromegaly, no signs of acromegaly. Skin:   warm, no rashes, no ecchymosis  Extremities: normal, no cyanosis, clubbing.  Other findings:    LABORATORY PANEL:   CBC No results for input(s): WBC, HGB, HCT, PLT in the last 168 hours. ------------------------------------------------------------------------------------------------------------------  Chemistries  Recent Labs  Lab 07/24/17 1505  CREATININE 1.20*   ------------------------------------------------------------------------------------------------------------------  Cardiac Enzymes No results for input(s): TROPONINI in the last 168 hours. ------------------------------------------------------------  RADIOLOGY:  Ct Chest W Contrast  Result Date: 07/24/2017 CLINICAL DATA:  Questionable mass in the lung on outside x-ray, some back pain, some shortness of breath EXAM: CT CHEST WITH CONTRAST TECHNIQUE: Multidetector CT imaging of the chest was performed during intravenous contrast administration. CONTRAST:  77mL ISOVUE-300 IOPAMIDOL (ISOVUE-300) INJECTION 61% COMPARISON:  None. FINDINGS: Cardiovascular: The heart is mildly enlarged. No pericardial effusion is seen. Mild to moderate  abdominal aortic atherosclerosis is present. Mid ascending thoracic aorta measures 36 mm in diameter. Mediastinum/Nodes: There is significantly enlarged mediastinal adenopathy present. On image 53 series 2 a massive nodes has diameter of 21 mm. A node in the precarinal region to the right of midline measures 23 mm on image 56. A right infrahilar node on image 78 measures 16 mm in short axis diameter. There are a few scattered small low-attenuation thyroid nodules present of doubtful significance. Lungs/Pleura: There is a large right hilar and suprahilar mass present extending into the right upper lobe with some involvement of the upper aspect of the right middle lobe. This lesion also appears to extend through the anterior pleura into the medial right chest wall, best seen on image 41. The right suprahilar mass on image 61 measures 43 x 63 mm. 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 This lesion appears to compress and probably directly invade the superior vena cava, and possibly the right main pulmonary artery. Also, the right mainstem bronchus is truncated,  being surrounded by this right suprahilar mass. There is a vague opacity peripherally in the right upper lobe on image 76 series 3 which could be due to mild volume loss with no solid component evident. However a more solid-appearing nodular lesion is present within the right lower lobe on image 82 measuring 23 mm suspicious for ipsilateral metastatic lesion. A small nodular opacity in the right lower lobe on image 104 could represent a metastatic lesion is well. A 6 mm noncalcified nodular lesion subpleural in the left lower lobe posteriorly on image 135 is suspicious for contralateral metastatic lesion. No other definite lung nodule or mass is seen. No pleural effusion is noted. Upper Abdomen: A small soft tissue nodule is noted posterior to the right lobe of liver on image 154 which could represent a metastasis. No definite hepatic lesion is  seen. Multiple gallstones layer within the gallbladder. Bilateral adrenal lesions are present. On the right right adrenal mass measures 2.8 x 2.2 cm, and the left adrenal lesion measures 2.5 x 4.9 cm, both of which are suspicious for bilateral adrenal metastases. Musculoskeletal: On bone window images, the sternum appears somewhat inhomogeneous and metastasis involving the sternum cannot be excluded. However, on coronal images this finding involving the sternum is not confirmed. No metastatic lesion involving the thoracic spine is seen. IMPRESSION: 1. Large right hilar-suprahilar mass extending into the right upper lobe and superior aspect of the right middle lobe consistent with primary lung carcinoma. 2. This right hilar mass appears to directly invade the adjacent superior vena cava and possibly the right main pulmonary artery, as well as the anterior right chest wall medially. 3. Suspect small bilateral lung metastases. 4. Mediastinal and right hilar adenopathy. 5. 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 6. Multiple gallstones. Electronically Signed   By: Ivar Drape M.D.   On: 07/24/2017 15:46       Thank  you for the consultation and for allowing [facility] to assist in the care of your patient. Our recommendations are noted above.  Please contact us if we can be of further service.   Marda Stalker, MD.  Board Certified in Internal Medicine, Pulmonary Medicine, [place], and Sleep Medicine.  [facility] Office Number: [phone]  Patricia Pesa, M.D.  Merton Border, M.D  07/25/2017

## 2017-07-25 NOTE — Telephone Encounter (Signed)
Pt is scheduled to have regular bronch. Information below.   Date: 07/27/17 Provider: Alva Garnet Procedure: Regular bronch DX: Lung mass

## 2017-07-25 NOTE — Telephone Encounter (Signed)
[place] no PA is required for either insurance. [employer] call Ref #[id number] [facility] Call Ref # [phone]. Rhonda J Cobb

## 2017-07-27 ENCOUNTER — Ambulatory Visit
Admission: RE | Admit: 2017-07-27 | Discharge: 2017-07-27 | Disposition: A | Discharge status: Home / Self Care | Payer: Medicare PPO | Source: Ambulatory Visit | Attending: Pulmonary Disease | Admitting: Pulmonary Disease

## 2017-07-27 ENCOUNTER — Encounter
Admission: RE | Disposition: A | Discharge status: Home / Self Care | Payer: Self-pay | Source: Ambulatory Visit | Attending: Pulmonary Disease

## 2017-07-27 ENCOUNTER — Other Ambulatory Visit: Payer: Self-pay | Admitting: *Deleted

## 2017-07-27 DIAGNOSIS — Z803 Family history of malignant neoplasm of breast: Secondary | ICD-10-CM | POA: Insufficient documentation

## 2017-07-27 DIAGNOSIS — Z7982 Long term (current) use of aspirin: Secondary | ICD-10-CM | POA: Diagnosis not present

## 2017-07-27 DIAGNOSIS — C3411 Malignant neoplasm of upper lobe, right bronchus or lung: Secondary | ICD-10-CM | POA: Insufficient documentation

## 2017-07-27 DIAGNOSIS — Z888 Allergy status to other drugs, medicaments and biological substances status: Secondary | ICD-10-CM | POA: Diagnosis not present

## 2017-07-27 DIAGNOSIS — R918 Other nonspecific abnormal finding of lung field: Secondary | ICD-10-CM

## 2017-07-27 DIAGNOSIS — I10 Essential (primary) hypertension: Secondary | ICD-10-CM | POA: Insufficient documentation

## 2017-07-27 DIAGNOSIS — Z79899 Other long term (current) drug therapy: Secondary | ICD-10-CM | POA: Diagnosis not present

## 2017-07-27 DIAGNOSIS — J209 Acute bronchitis, unspecified: Secondary | ICD-10-CM | POA: Diagnosis not present

## 2017-07-27 HISTORY — PX: FLEXIBLE BRONCHOSCOPY: SHX5094

## 2017-07-27 SURGERY — BRONCHOSCOPY, FLEXIBLE
Anesthesia: Moderate Sedation

## 2017-07-27 MED ORDER — MIDAZOLAM HCL 5 MG/5ML IJ SOLN
INTRAMUSCULAR | Status: AC | PRN
  Administered 2017-07-27: 1 mg via INTRAVENOUS

## 2017-07-27 MED ORDER — MIDAZOLAM HCL 2 MG/2ML IJ SOLN
INTRAMUSCULAR | Status: AC | PRN
  Administered 2017-07-27: 2 mg via INTRAVENOUS

## 2017-07-27 MED ORDER — BUTAMBEN-TETRACAINE-BENZOCAINE 2-2-14 % EX AERO
1.0000 | INHALATION_SPRAY | Freq: Once | CUTANEOUS | Status: DC
  Filled 2017-07-27: qty 20

## 2017-07-27 MED ORDER — LIDOCAINE HCL 2 % EX GEL
1.0000 "application " | Freq: Once | CUTANEOUS | Status: DC
  Filled 2017-07-27: qty 5

## 2017-07-27 MED ORDER — PHENYLEPHRINE HCL 0.25 % NA SOLN
1.0000 | Freq: Four times a day (QID) | NASAL | Status: DC | PRN
  Filled 2017-07-27: qty 15

## 2017-07-27 MED ORDER — FENTANYL CITRATE (PF) 100 MCG/2ML IJ SOLN
INTRAMUSCULAR | Status: AC | PRN
  Administered 2017-07-27: 50 ug via INTRAVENOUS

## 2017-07-27 NOTE — Progress Notes (Signed)
Continues to remain clinically stable post procedure. Discharge instructions given with questions answered. Denies complaints. Taking po's without difficulty.

## 2017-07-27 NOTE — Procedures (Signed)
Indication:   Lung mass  Premedication:   Fentanyl 50 mcg Midaz 2 mg + 1 mg  Anesthesia: Topical to nose and throat 40 cc of 1% lidocaine used during the course of procedure  Procedure: After adequate sedation and anesthesia, the bronchoscope was introduced via the L naris and advanced into the posterior pharynx. Further anesthesia was obtained with 1% lidocaine and the scope was advanced into the trachea. Complete airway anesthesia was achieved with 1% lidocaine and a thorough airway examination was performed. This revealed the following findings:  Findings:  Upper airway - normal. Cords moved symmetrically Tracheobronchial anatomy - normal anatomic arrangement Bronchial mucosa - mild chronic and moderate acute bronchitis Other - RUL bronchus is 100% obstructed with mostly extrinsic compression but there was also tumor infiltration of the bronchial mucosa at the orifice of the RUL take-off  Specimens:   Transbronchial needle biopsy RUL X 2 passes  Endobronchial biopsy from RUL X 8 passess Cytology brushings from RUL X 2 passes  Commentary: On initial pass of the transrectal needle was significant bleeding requiring scope tamponade.  With control of bleeding, the needle was passed on a second occasion in a different location.  After this, endobronchial biopsies were obtained at the orifice of the right upper lobe.  There was moderate bleeding which was easily controlled with the scope tamponade.  Cytology brushing x2 was also performed.  Initial pathology was positive for malignancy on cytology and touch prep of the biopsy specimen.  At the time of completion of the procedure, all bleeding had been controlled.  After the procedure, I spoke with patient and her husband regarding the findings.  I have requested a consultation with oncology for next week and have requested that she be placed on [facility] conference list for 08/02/17  Post procedure evaluation:  The patient tolerated the  procedure well with no major complications CXR - not indicated She was comfortable on room air post procedure with minimal rhonchi and no wheezes.   Merton Border, MD PCCM service Mobile [phone] Pager [phone]  07/27/2017 4:47 PM

## 2017-07-27 NOTE — Discharge Instructions (Signed)
Moderate Conscious Sedation, Adult, Care After These instructions provide you with information about caring for yourself after your procedure. Your health care provider may also give you more specific instructions. Your treatment has been planned according to current medical practices, but problems sometimes occur. Call your health care provider if you have any problems or questions after your procedure. What can I expect after the procedure? After your procedure, it is common:  To feel sleepy for several hours.  To feel clumsy and have poor balance for several hours.  To have poor judgment for several hours.  To vomit if you eat too soon.  Follow these instructions at home: For at least 24 hours after the procedure:   Do not: ? Participate in activities where you could fall or become injured. ? Drive. ? Use heavy machinery. ? Drink alcohol. ? Take sleeping pills or medicines that cause drowsiness. ? Make important decisions or sign legal documents. ? Take care of children on your own.  Rest. Eating and drinking  Follow the diet recommended by your health care provider.  If you vomit: ? Drink water, juice, or soup when you can drink without vomiting. ? Make sure you have little or no nausea before eating solid foods. General instructions  Have a responsible adult stay with you until you are awake and alert.  Take over-the-counter and prescription medicines only as told by your health care provider.  If you smoke, do not smoke without supervision.  Keep all follow-up visits as told by your health care provider. This is important. Contact a health care provider if:  You keep feeling nauseous or you keep vomiting.  You feel light-headed.  You develop a rash.  You have a fever. Get help right away if:  You have trouble breathing. This information is not intended to replace advice given to you by your health care provider. Make sure you discuss any questions you have  with your health care provider. Document Released: 03/05/2013 Document Revised: 10/18/2015 Document Reviewed: 09/04/2015 Elsevier Interactive Patient Education  2018 [employer]. Flexible Bronchoscopy, Care After These instructions give you information on caring for yourself after your procedure. Your doctor may also give you more specific instructions. Call your doctor if you have any problems or questions after your procedure. Follow these instructions at home:  Do not eat or drink anything for 2 hours after your procedure. If you try to eat or drink before the medicine wears off, food or drink could go into your lungs. You could also burn yourself.  After 2 hours have passed and when you can cough and gag normally, you may eat soft food and drink liquids slowly.  The day after the test, you may eat your normal diet.  You may do your normal activities.  Keep all doctor visits. Get help right away if:  You get more and more short of breath.  You get light-headed.  You feel like you are going to pass out (faint).  You have chest pain.  You have new problems that worry you.  You cough up more than a little blood.  You cough up more blood than before. This information is not intended to replace advice given to you by your health care provider. Make sure you discuss any questions you have with your health care provider. Document Released: 03/12/2009 Document Revised: 10/21/2015 Document Reviewed: 01/17/2013 Elsevier Interactive Patient Education  2017 [employer].

## 2017-07-27 NOTE — Progress Notes (Signed)
Patient clinically stable post bronchoscopy per Dr Leonidas Romberg, husband at bedside. Pt tolerated well with minimal bleeding. Denies complaints. Dr Leonidas Romberg out to speak with patient and husband regarding procedure with questions answered. To have pt set up ASAP with oncologist for more testing ASAP.

## 2017-07-27 NOTE — H&P (Signed)
Patient was seen by Dr. Ashby Dawes on 07/25/17 with lung mass.  I met her briefly in the office on that day A we made arrangements for me to perform the bronchoscopy.  I have reviewed her PMH, surgical history, family history and social history which are as documented in Dr. Mathis Fare note  Vitals:   07/27/17 1430 07/27/17 1445 07/27/17 1446 07/27/17 1502  BP: 133/72 (!) 142/75  132/85  Pulse: 83 83 82 80  Resp: (!) 21 19 19 19   Temp:      TempSrc:      SpO2: 97% 93% 93% 93%  Weight:      Height:          Gen: WDWN in NAD HEENT: NCAT, sclerae white, oropharynx normal Neck: No LAN, no JVD noted Lungs: full BS, normal percussion note throughout, no adventitious sounds Cardiovascular: Regular, normal rate, no M noted Abdomen: Soft, NT, +BS Ext: no C/C/E Neuro: PERRL, EOMI, motor/sensory grossly intact Skin: No lesions noted   Impression: Lung mass  Plan: Bronchoscopy with biopsy  Merton Border, MD PCCM service Mobile [phone] Pager [phone] 07/27/2017 4:49 PM

## 2017-07-30 ENCOUNTER — Encounter: Payer: Self-pay | Admitting: *Deleted

## 2017-07-30 ENCOUNTER — Encounter: Payer: Self-pay | Admitting: Pulmonary Disease

## 2017-07-30 NOTE — Progress Notes (Signed)
  Oncology Nurse Navigator Documentation  Navigator Location: [facility] (07/30/17 0900) Referral date to RadOnc/MedOnc: 07/27/17 (07/30/17 0900) )Navigator Encounter Type: Introductory phone call;Telephone (07/30/17 0900) Telephone: Lahoma Crocker Call;Appt Confirmation/Clarification (07/30/17 0900) Abnormal Finding Date: 07/24/17 (07/30/17 0900)                     Barriers/Navigation Needs: Coordination of Care (07/30/17 0900)   Interventions: Coordination of Care (07/30/17 0900)   Coordination of Care: Appts (07/30/17 0900)        Acuity: Level 2 (07/30/17 0900)   Acuity Level 2: Initial guidance, education and coordination as needed;Educational needs;Assistance expediting appointments (07/30/17 0900)  phone call made to patient to give new patient appointment scheduled with Dr. Tasia Catchings on Thursday 3/7 at 1:45pm. Pt instructed to arrive at 1:30pm to check in and to bring photo ID and insurance cards. Pt did not have any questions during phone call. Contact info given and instructed to call if had any questions or needs prior to appt. Pt verbalized understanding.   Time Spent with Patient: 30 (07/30/17 0900)

## 2017-07-31 ENCOUNTER — Other Ambulatory Visit: Payer: Self-pay | Admitting: Pathology

## 2017-07-31 LAB — CYTOLOGY - NON PAP

## 2017-08-01 LAB — SURGICAL PATHOLOGY

## 2017-08-02 ENCOUNTER — Other Ambulatory Visit: Payer: Self-pay | Admitting: *Deleted

## 2017-08-02 ENCOUNTER — Inpatient Hospital Stay: Payer: Medicare PPO

## 2017-08-02 ENCOUNTER — Encounter: Payer: Self-pay | Admitting: Oncology

## 2017-08-02 ENCOUNTER — Inpatient Hospital Stay: Payer: Medicare PPO | Attending: Oncology | Admitting: Oncology

## 2017-08-02 ENCOUNTER — Encounter: Payer: Self-pay | Admitting: *Deleted

## 2017-08-02 VITALS — BP 138/82 | HR 92 | Temp 98.8°F | Resp 16 | Wt 290.0 lb

## 2017-08-02 DIAGNOSIS — I639 Cerebral infarction, unspecified: Secondary | ICD-10-CM | POA: Insufficient documentation

## 2017-08-02 DIAGNOSIS — D649 Anemia, unspecified: Secondary | ICD-10-CM

## 2017-08-02 DIAGNOSIS — D72829 Elevated white blood cell count, unspecified: Secondary | ICD-10-CM | POA: Insufficient documentation

## 2017-08-02 DIAGNOSIS — E876 Hypokalemia: Secondary | ICD-10-CM

## 2017-08-02 DIAGNOSIS — D509 Iron deficiency anemia, unspecified: Secondary | ICD-10-CM

## 2017-08-02 DIAGNOSIS — E860 Dehydration: Secondary | ICD-10-CM | POA: Insufficient documentation

## 2017-08-02 DIAGNOSIS — R918 Other nonspecific abnormal finding of lung field: Secondary | ICD-10-CM

## 2017-08-02 DIAGNOSIS — R911 Solitary pulmonary nodule: Secondary | ICD-10-CM

## 2017-08-02 DIAGNOSIS — C342 Malignant neoplasm of middle lobe, bronchus or lung: Secondary | ICD-10-CM | POA: Insufficient documentation

## 2017-08-02 DIAGNOSIS — I10 Essential (primary) hypertension: Secondary | ICD-10-CM

## 2017-08-02 LAB — FOLATE: Folate: 19.1 ng/mL (ref >5.9)

## 2017-08-02 LAB — COMPREHENSIVE METABOLIC PANEL
ALT: 16 U/L (ref 14–54)
ANION GAP: 14 (ref 5–15)
AST: 20 U/L (ref 15–41)
Albumin: 3.2 g/dL — ABNORMAL LOW (ref 3.5–5.0)
Alkaline Phosphatase: 70 U/L (ref 38–126)
BUN: 16 mg/dL (ref 6–20)
CHLORIDE: 91 mmol/L — AB (ref 101–111)
CO2: 29 mmol/L (ref 22–32)
Calcium: 9.2 mg/dL (ref 8.9–10.3)
Creatinine, Ser: 1.14 mg/dL — ABNORMAL HIGH (ref 0.44–1.00)
GFR calc non Af Amer: 46 mL/min — ABNORMAL LOW (ref >60)
GFR, EST AFRICAN AMERICAN: 53 mL/min — AB (ref >60)
Glucose, Bld: 145 mg/dL — ABNORMAL HIGH (ref 65–99)
Potassium: 3.1 mmol/L — ABNORMAL LOW (ref 3.5–5.1)
SODIUM: 134 mmol/L — AB (ref 135–145)
Total Bilirubin: 0.8 mg/dL (ref 0.3–1.2)
Total Protein: 7.5 g/dL (ref 6.5–8.1)

## 2017-08-02 LAB — CBC WITH DIFFERENTIAL/PLATELET
Basophils Absolute: 0.1 10*3/uL (ref 0–0.1)
Basophils Relative: 0 %
Eosinophils Absolute: 0.5 10*3/uL (ref 0–0.7)
Eosinophils Relative: 3 %
HCT: 34.6 % — ABNORMAL LOW (ref 35.0–47.0)
HEMOGLOBIN: 11.1 g/dL — AB (ref 12.0–16.0)
LYMPHS ABS: 1.6 10*3/uL (ref 1.0–3.6)
Lymphocytes Relative: 10 %
MCH: 25 pg — AB (ref 26.0–34.0)
MCHC: 32 g/dL (ref 32.0–36.0)
MCV: 78.2 fL — AB (ref 80.0–100.0)
MONO ABS: 0.7 10*3/uL (ref 0.2–0.9)
MONOS PCT: 4 %
NEUTROS PCT: 83 %
Neutro Abs: 12.8 10*3/uL — ABNORMAL HIGH (ref 1.4–6.5)
Platelets: 268 10*3/uL (ref 150–440)
RBC: 4.42 MIL/uL (ref 3.80–5.20)
RDW: 17.5 % — ABNORMAL HIGH (ref 11.5–14.5)
WBC: 15.6 10*3/uL — ABNORMAL HIGH (ref 3.6–11.0)

## 2017-08-02 LAB — IRON AND TIBC
IRON: 25 ug/dL — AB (ref 28–170)
Saturation Ratios: 8 % — ABNORMAL LOW (ref 10.4–31.8)
TIBC: 310 ug/dL (ref 250–450)
UIBC: 285 ug/dL

## 2017-08-02 LAB — FERRITIN: Ferritin: 74 ng/mL (ref 11–307)

## 2017-08-02 LAB — VITAMIN B12: VITAMIN B 12: 2030 pg/mL — AB (ref 180–914)

## 2017-08-02 MED ORDER — HYDROCODONE-ACETAMINOPHEN 5-325 MG PO TABS: 1.0000 | ORAL_TABLET | Freq: Four times a day (QID) | ORAL | 0 refills | Status: AC | PRN

## 2017-08-02 MED ORDER — POTASSIUM CHLORIDE CRYS ER 20 MEQ PO TBCR: 20.0000 meq | EXTENDED_RELEASE_TABLET | Freq: Every day | ORAL | 0 refills | Status: AC

## 2017-08-02 NOTE — Progress Notes (Signed)
Hematology/Oncology Consult note [facility] Telephone:([phone] [phone]   Patient Care Team: Baxter Hire, MD as PCP - General (Internal Medicine) Telford Nab, RN as Registered Nurse  REFERRING PROVIDER: [facility]/ Dr.Simond  CHIEF COMPLAINTS/PURPOSE OF CONSULTATION:  Evaluation of newly found lung carcinoma  HISTORY OF PRESENTING ILLNESS:  Carrie Marquez is a  77 y.o.  female with PMH listed below who was referred to me for evaluation of lung carcinoma.  Patient is a never smoker.  She has insidious onset back pain for which she underwent image workup and with incidental finding of a large right lung mass.  Patient denies any shortness of breath at rest.  She did mention that she started to feel shortness of breath with exertion lately.  Denies any symptoms suggestive of SVC syndrome. CT2/26/ 2019 showed large right hilar suprahilar mass extending into the right upper lobe and the superior aspect of the right middle lobe consistent with primary lung carcinoma.  Mass extends through the anterior pleura into the medial right chest wall.  The right suprahilar mass measures 43 x 63 mm, appears to compress are probably directly invade the superior vena cava, and possibly right main pulmonary artery.  She also has a small nodular opacity in the right lower lobe, and another small lesion in the left lower lobe.  Upper abdomen showed small soft tissue nodules posterior to the right lobe of liver, and bilateral adrenal lesions.  No suspicious bone lesions was mentioned .  Patient was seen and evaluated by pulmonology Dr. Shawna Orleans.  Patient underwent biopsy via bronchoscopy.  Pathology showed high-grade pleomorphic carcinoma.  A panel of the immunohistochemistry stain was performed with the following pattern: TTF-1 negative, Inapsine negative, S100 negative, P 40 negative ,Mucicarmine: Negative.  The differential diagnosis includes primary  non-small cell lung cancer and metastatic carcinoma.  Pathologist prefer to preserve the tissue for ancillary testing, unless PET scan suggest a primary site outside of the lung.  Patient's case was discussed on the multidisciplinary tumor conference on August 02, 2017.  Patient was accompanied by her husband and daughter to clinic today.  She reports mild shortness of breath with exertion but otherwise no respiratory distress.  Her weight has been stable.  Appetite is very poor and she feels fullness and sometimes nauseated.  Denies any facial swelling, headache, double vision. She has mid back pain for which she takes hydrocodone every 4 hours as needed.  Usually she takes 2-3 pills a day.  Review of Systems  Constitutional: Positive for malaise/fatigue. Negative for chills, fever and weight loss.  HENT: Negative for hearing loss and nosebleeds.   Eyes: Negative for double vision and photophobia.  Respiratory: Negative for cough, hemoptysis and sputum production.   Cardiovascular: Negative for chest pain and claudication.  Gastrointestinal: Positive for nausea. Negative for abdominal pain, blood in stool, heartburn and vomiting.  Genitourinary: Negative for dysuria.  Musculoskeletal: Positive for back pain. Negative for myalgias.  Skin: Negative for rash.  Neurological: Negative for dizziness, sensory change, speech change and weakness.  Endo/Heme/Allergies: Does not bruise/bleed easily.  Psychiatric/Behavioral: Negative for depression and substance abuse.    MEDICAL HISTORY:  Past Medical History:  Diagnosis Date  . Hypertension     SURGICAL HISTORY: Past Surgical History:  Procedure Laterality Date  . FLEXIBLE BRONCHOSCOPY N/A 07/27/2017   Procedure: FLEXIBLE BRONCHOSCOPY;  Surgeon: Wilhelmina Mcardle, MD;  Location: [facility] ORS;  Service: Pulmonary;  Laterality: N/A;  . OOPHORECTOMY Right  SOCIAL HISTORY: Social History   Socioeconomic History  . Marital status: Married     Spouse name: Not on file  . Number of children: Not on file  . Years of education: Not on file  . Highest education level: Not on file  Social Needs  . Financial resource strain: Not on file  . Food insecurity - worry: Not on file  . Food insecurity - inability: Not on file  . Transportation needs - medical: Not on file  . Transportation needs - non-medical: Not on file  Occupational History  . Not on file  Tobacco Use  . Smoking status: Never Smoker  . Smokeless tobacco: Never Used  Substance and Sexual Activity  . Alcohol use: Not on file  . Drug use: Not on file  . Sexual activity: Not on file  Other Topics Concern  . Not on file  Social History Narrative  . Not on file    FAMILY HISTORY: Family History  Problem Relation Age of Onset  . Breast cancer Mother        31's  . Breast cancer Maternal Aunt     ALLERGIES:  is allergic to sertaconazole and statins.  MEDICATIONS:  Current Outpatient Medications  Medication Sig Dispense Refill  . albuterol (VENTOLIN HFA) 108 (90 BASE) MCG/ACT inhaler Inhale 1 puff into the lungs every 6 (six) hours as needed for wheezing or shortness of breath.     Marland Kitchen aspirin EC 81 MG tablet Take 81 mg by mouth daily.    . carvedilol (COREG) 6.25 MG tablet Take 6.25 mg by mouth 2 (two) times daily with a meal.     . Cholecalciferol (VITAMIN D-1000 MAX ST) 1000 UNITS tablet Take 1,000 Units by mouth daily.     . citalopram (CELEXA) 20 MG tablet Take 20 mg by mouth daily.     . Cyanocobalamin (RA VITAMIN B-12 TR) 1000 MCG TBCR Take 1,000 mcg by mouth daily.     . fluticasone (FLONASE) 50 MCG/ACT nasal spray Place 1 spray into both nostrils at bedtime.     Marland Kitchen LORazepam (ATIVAN) 0.5 MG tablet Take 0.5 mg by mouth 3 (three) times daily as needed for anxiety.     Marland Kitchen losartan-hydrochlorothiazide (HYZAAR) 100-25 MG per tablet Take 1 tablet by mouth daily.     . meloxicam (MOBIC) 15 MG tablet Take 15 mg by mouth daily.     . simvastatin (ZOCOR) 20 MG  tablet Take 10 mg by mouth daily.     Marland Kitchen acetaminophen (TYLENOL) 500 MG tablet Take 1,000 mg by mouth every 6 (six) hours as needed for moderate pain or headache.    . gabapentin (NEURONTIN) 300 MG capsule Take 300 mg by mouth at bedtime.     No current facility-administered medications for this visit.      PHYSICAL EXAMINATION: ECOG PERFORMANCE STATUS: 2 - Symptomatic, <50% confined to bed Vitals:   08/02/17 1351  Resp: 16  Temp: 98.8 F (37.1 C)   Filed Weights   08/02/17 1351  Weight: 290 lb (131.5 kg)    Physical Exam  Constitutional: She is oriented to person, place, and time and well-developed, well-nourished, and in no distress. No distress.  HENT:  Head: Normocephalic and atraumatic.  Mouth/Throat: No oropharyngeal exudate.  Eyes: EOM are normal. Pupils are equal, round, and reactive to light. No scleral icterus.  Neck: Neck supple.  Cardiovascular: Normal rate and regular rhythm.  No murmur heard. Pulmonary/Chest: Effort normal and breath sounds normal. No respiratory  distress. She has no rales.  Abdominal: Soft. Bowel sounds are normal. She exhibits no mass. There is no rebound.  Musculoskeletal: Normal range of motion. She exhibits no edema.  Lymphadenopathy:    She has no cervical adenopathy.  Neurological: She is alert and oriented to person, place, and time. No cranial nerve deficit.  Skin: Skin is warm and dry. No erythema.  Psychiatric: Affect and judgment normal.     LABORATORY DATA:  I have reviewed the data as listed No results found for: WBC, HGB, HCT, MCV, PLT Recent Labs    07/24/17 1505  CREATININE 1.20*       ASSESSMENT & PLAN:  1. Mass of right lung   2. Solitary pulmonary nodule   3. Normocytic anemia   4. Hypokalemia   5. Iron deficiency anemia, unspecified iron deficiency anemia type    I had a lengthy discussion with patient and her family members regarding the diagnosis of high-grade pleomorphic carcinoma, image study  abnormality, the rationale of additional images workup, and the molecular study.   Plan obtain PET scan and MRI of the brain to complete staging.  We will send foundation one testing and PDL 1.  Further treatment plan pending above workup results. Check CBC and CMP today # Labs showed hypokalemia, will add on magnesium level.  Prescription of potassium will be sent to pharmacy and the patient will be called. #Iron deficiency anemia, decreased iron saturation 8%.  Will discuss with patient about IV iron at the next visit.  All questions were answered. The patient knows to call the clinic with any problems questions or concerns.  Return of visit: After intact and a brain MRI. Thank you for this kind referral and the opportunity to participate in the care of this patient. A copy of today's note is routed to referring provider    Earlie Server, MD, PhD Hematology Oncology [facility] Pager- [phone] 08/02/2017

## 2017-08-03 NOTE — Progress Notes (Signed)
  Oncology Nurse Navigator Documentation  Navigator Location: [facility]Med Onc (08/02/17 1500)   )Navigator Encounter Type: Initial MedOnc (08/02/17 1500)     Confirmed Diagnosis Date: 08/01/17 (08/02/17 1500)                 Treatment Phase: Pre-Tx/Tx Discussion (08/02/17 1500) Barriers/Navigation Needs: Coordination of Care;Education (08/02/17 1500) Education: Understanding Cancer/ Treatment Options;Newly Diagnosed Cancer Education (08/02/17 1500) Interventions: Coordination of Care (08/02/17 1500)   Coordination of Care: Appts;Radiology (08/02/17 1500)         met with patient during initial med-onc consultation with Dr. Tasia Catchings to discuss biopsy results and treatment planning. All questions answered at the time of visit. Reassurance provided. Reviewed upcoming appts with patient and family. Pt given materials for educational resources and support services. Contact info given and instructed to call with any further questions or needs. Pt and family verbalized understanding.         Time Spent with Patient: 60 (08/02/17 1500)

## 2017-08-08 ENCOUNTER — Telehealth: Payer: Self-pay | Admitting: *Deleted

## 2017-08-08 MED ORDER — MEGESTROL ACETATE 40 MG PO TABS: 80.0000 mg | ORAL_TABLET | Freq: Two times a day (BID) | ORAL | 0 refills | Status: AC

## 2017-08-08 NOTE — Telephone Encounter (Signed)
Pt called in to report that has decreased appetite which may associated with her depression. States is already on citalopram 20mg  daily for depression and is requesting medication to stimulate her appetite. Per Dr. Tasia Catchings, may prescribe megace 80mg  BID. Prescription sent electronically. Pt notified. Nothing further needed.

## 2017-08-09 ENCOUNTER — Ambulatory Visit
Admission: RE | Admit: 2017-08-09 | Discharge: 2017-08-09 | Disposition: A | Discharge status: Home / Self Care | Payer: Medicare PPO | Source: Ambulatory Visit | Attending: Oncology | Admitting: Oncology

## 2017-08-09 DIAGNOSIS — K8020 Calculus of gallbladder without cholecystitis without obstruction: Secondary | ICD-10-CM | POA: Diagnosis not present

## 2017-08-09 DIAGNOSIS — C7951 Secondary malignant neoplasm of bone: Secondary | ICD-10-CM | POA: Insufficient documentation

## 2017-08-09 DIAGNOSIS — C7800 Secondary malignant neoplasm of unspecified lung: Secondary | ICD-10-CM | POA: Diagnosis not present

## 2017-08-09 DIAGNOSIS — R911 Solitary pulmonary nodule: Secondary | ICD-10-CM | POA: Diagnosis present

## 2017-08-09 DIAGNOSIS — I700 Atherosclerosis of aorta: Secondary | ICD-10-CM | POA: Diagnosis not present

## 2017-08-09 DIAGNOSIS — C7972 Secondary malignant neoplasm of left adrenal gland: Secondary | ICD-10-CM | POA: Insufficient documentation

## 2017-08-09 DIAGNOSIS — C781 Secondary malignant neoplasm of mediastinum: Secondary | ICD-10-CM | POA: Insufficient documentation

## 2017-08-09 DIAGNOSIS — C7971 Secondary malignant neoplasm of right adrenal gland: Secondary | ICD-10-CM | POA: Diagnosis not present

## 2017-08-09 HISTORY — DX: Solitary pulmonary nodule: R91.1

## 2017-08-09 LAB — GLUCOSE, CAPILLARY: Glucose-Capillary: 149 mg/dL — ABNORMAL HIGH (ref 65–99)

## 2017-08-09 MED ORDER — FLUDEOXYGLUCOSE F - 18 (FDG) INJECTION
16.3400 | Freq: Once | INTRAVENOUS | Status: AC | PRN
  Administered 2017-08-09: 16.34 via INTRAVENOUS

## 2017-08-10 ENCOUNTER — Inpatient Hospital Stay: Payer: Medicare PPO

## 2017-08-10 ENCOUNTER — Other Ambulatory Visit: Payer: Self-pay

## 2017-08-10 ENCOUNTER — Inpatient Hospital Stay (HOSPITAL_BASED_OUTPATIENT_CLINIC_OR_DEPARTMENT_OTHER): Payer: Medicare PPO | Admitting: Oncology

## 2017-08-10 ENCOUNTER — Encounter: Payer: Self-pay | Admitting: *Deleted

## 2017-08-10 VITALS — BP 99/57 | HR 91 | Temp 96.0°F | Resp 18

## 2017-08-10 DIAGNOSIS — I639 Cerebral infarction, unspecified: Secondary | ICD-10-CM | POA: Diagnosis not present

## 2017-08-10 DIAGNOSIS — E876 Hypokalemia: Secondary | ICD-10-CM

## 2017-08-10 DIAGNOSIS — C3490 Malignant neoplasm of unspecified part of unspecified bronchus or lung: Secondary | ICD-10-CM

## 2017-08-10 DIAGNOSIS — N179 Acute kidney failure, unspecified: Secondary | ICD-10-CM

## 2017-08-10 DIAGNOSIS — R0902 Hypoxemia: Secondary | ICD-10-CM | POA: Diagnosis not present

## 2017-08-10 DIAGNOSIS — D509 Iron deficiency anemia, unspecified: Secondary | ICD-10-CM | POA: Diagnosis not present

## 2017-08-10 DIAGNOSIS — C342 Malignant neoplasm of middle lobe, bronchus or lung: Secondary | ICD-10-CM

## 2017-08-10 DIAGNOSIS — E860 Dehydration: Secondary | ICD-10-CM | POA: Diagnosis not present

## 2017-08-10 DIAGNOSIS — Z7189 Other specified counseling: Secondary | ICD-10-CM

## 2017-08-10 LAB — CBC WITH DIFFERENTIAL/PLATELET
BASOS PCT: 1 %
Basophils Absolute: 0.1 10*3/uL (ref 0–0.1)
EOS ABS: 0.6 10*3/uL (ref 0–0.7)
Eosinophils Relative: 3 %
HEMATOCRIT: 33.2 % — AB (ref 35.0–47.0)
Hemoglobin: 10.6 g/dL — ABNORMAL LOW (ref 12.0–16.0)
Lymphocytes Relative: 10 %
Lymphs Abs: 2 10*3/uL (ref 1.0–3.6)
MCH: 24.8 pg — ABNORMAL LOW (ref 26.0–34.0)
MCHC: 31.9 g/dL — ABNORMAL LOW (ref 32.0–36.0)
MCV: 77.7 fL — ABNORMAL LOW (ref 80.0–100.0)
MONOS PCT: 5 %
Monocytes Absolute: 1 10*3/uL — ABNORMAL HIGH (ref 0.2–0.9)
NEUTROS ABS: 16.4 10*3/uL — AB (ref 1.4–6.5)
NEUTROS PCT: 81 %
Platelets: 345 10*3/uL (ref 150–440)
RBC: 4.27 MIL/uL (ref 3.80–5.20)
RDW: 18.4 % — ABNORMAL HIGH (ref 11.5–14.5)
WBC: 20.1 10*3/uL — AB (ref 3.6–11.0)

## 2017-08-10 LAB — COMPREHENSIVE METABOLIC PANEL
ALK PHOS: 68 U/L (ref 38–126)
ALT: 13 U/L — AB (ref 14–54)
ANION GAP: 13 (ref 5–15)
AST: 19 U/L (ref 15–41)
Albumin: 3.1 g/dL — ABNORMAL LOW (ref 3.5–5.0)
BUN: 19 mg/dL (ref 6–20)
CALCIUM: 8.9 mg/dL (ref 8.9–10.3)
CO2: 31 mmol/L (ref 22–32)
CREATININE: 1.69 mg/dL — AB (ref 0.44–1.00)
Chloride: 89 mmol/L — ABNORMAL LOW (ref 101–111)
GFR, EST AFRICAN AMERICAN: 33 mL/min — AB (ref >60)
GFR, EST NON AFRICAN AMERICAN: 28 mL/min — AB (ref >60)
Glucose, Bld: 120 mg/dL — ABNORMAL HIGH (ref 65–99)
Potassium: 3.1 mmol/L — ABNORMAL LOW (ref 3.5–5.1)
Sodium: 133 mmol/L — ABNORMAL LOW (ref 135–145)
Total Bilirubin: 0.6 mg/dL (ref 0.3–1.2)
Total Protein: 7.1 g/dL (ref 6.5–8.1)

## 2017-08-10 LAB — MAGNESIUM: MAGNESIUM: 2 mg/dL (ref 1.7–2.4)

## 2017-08-10 NOTE — Progress Notes (Signed)
Here  For follow up  BP lower today [phone] -feels a little light headed  -drinking qs fluids she stated .  Report to Dr Tasia Catchings

## 2017-08-12 ENCOUNTER — Encounter: Payer: Self-pay | Admitting: Oncology

## 2017-08-12 DIAGNOSIS — Z7189 Other specified counseling: Secondary | ICD-10-CM | POA: Insufficient documentation

## 2017-08-12 DIAGNOSIS — D509 Iron deficiency anemia, unspecified: Secondary | ICD-10-CM

## 2017-08-12 DIAGNOSIS — C3491 Malignant neoplasm of unspecified part of right bronchus or lung: Secondary | ICD-10-CM | POA: Insufficient documentation

## 2017-08-12 HISTORY — DX: Iron deficiency anemia, unspecified: D50.9

## 2017-08-12 MED ORDER — PROCHLORPERAZINE MALEATE 10 MG PO TABS: 10.0000 mg | ORAL_TABLET | Freq: Four times a day (QID) | ORAL | 1 refills | Status: DC | PRN

## 2017-08-12 MED ORDER — LIDOCAINE-PRILOCAINE 2.5-2.5 % EX CREA: TOPICAL_CREAM | CUTANEOUS | 3 refills | Status: DC

## 2017-08-12 MED ORDER — ONDANSETRON HCL 8 MG PO TABS: 8.0000 mg | ORAL_TABLET | Freq: Two times a day (BID) | ORAL | 1 refills | Status: DC | PRN

## 2017-08-12 NOTE — Progress Notes (Signed)
START ON PATHWAY REGIMEN - Non-Small Cell Lung     A cycle is 21 days:     Pembrolizumab   **Always confirm dose/schedule in your pharmacy ordering system**    Patient Characteristics: Stage IV Metastatic, Nonsquamous, Initial Chemotherapy/Immunotherapy, PS = 2, PD-L1 Expression Positive ? 50% (TPS) AJCC T Category: T4 Current Disease Status: Distant Metastases AJCC N Category: NX AJCC M Category: M1c AJCC 8 Stage Grouping: IVB Histology: Nonsquamous Cell ROS1 Rearrangement Status: Awaiting Test Results T790M Mutation Status: Not Applicable - EGFR Mutation Negative/Unknown Other Mutations/Biomarkers: No Other Actionable Mutations PD-L1 Expression Status: PD-L1 Positive ? 50% (TPS) Chemotherapy/Immunotherapy LOT: Initial Chemotherapy/Immunotherapy Molecular Targeted Therapy: Not Appropriate ALK Translocation Status: Awaiting Test Results Would you be surprised if this patient died  in the next year<= I would be surprised if this patient died in the next year EGFR Mutation Status: Awaiting Test Results BRAF V600E Mutation Status: Awaiting Test Results Performance Status: PS = 2 Intent of Therapy: Non-Curative / Palliative Intent, Discussed with Patient

## 2017-08-12 NOTE — Progress Notes (Addendum)
Hematology/Oncology Consult note [facility] Telephone:([phone] [phone]   Patient Care Team: Baxter Hire, MD as PCP - General (Internal Medicine) Telford Nab, RN as Registered Nurse  REFERRING PROVIDER: [facility]/ Dr.Simond  CHIEF COMPLAINTS/PURPOSE OF CONSULTATION:  Evaluation of newly found lung carcinoma  HISTORY OF PRESENTING ILLNESS:  Carrie Marquez is a  77 y.o.  female with PMH listed below who was referred to me for evaluation of lung carcinoma.  Patient is a never smoker.  She has insidious onset back pain for which she underwent image workup and with incidental finding of a large right lung mass.  Patient denies any shortness of breath at rest.  She did mention that she started to feel shortness of breath with exertion lately.  Denies any symptoms suggestive of SVC syndrome. CT2/26/ 2019 showed large right hilar suprahilar mass extending into the right upper lobe and the superior aspect of the right middle lobe consistent with primary lung carcinoma.  Mass extends through the anterior pleura into the medial right chest wall.  The right suprahilar mass measures 43 x 63 mm, appears to compress are probably directly invade the superior vena cava, and possibly right main pulmonary artery.  She also has a small nodular opacity in the right lower lobe, and another small lesion in the left lower lobe.  Upper abdomen showed small soft tissue nodules posterior to the right lobe of liver, and bilateral adrenal lesions.  No suspicious bone lesions was mentioned .  Patient was seen and evaluated by pulmonology Dr. Shawna Orleans.  Patient underwent biopsy via bronchoscopy.  Pathology showed high-grade pleomorphic carcinoma.  A panel of the immunohistochemistry stain was performed with the following pattern: TTF-1 negative, Inapsine negative, S100 negative, P 40 negative ,Mucicarmine: Negative.  The differential diagnosis includes primary  non-small cell lung cancer and metastatic carcinoma.  Pathologist prefer to preserve the tissue for ancillary testing, unless PET scan suggest a primary site outside of the lung.  Patient's case was discussed on the multidisciplinary tumor conference on August 02, 2017.  INTERVAL HISTORY Carrie Marquez is a 77 y.o. female who has above history reviewed by me today presents for follow up visit for management of lung carcinoma.  She was accompanied by her daughter and husband. She has reported poor appetite and poor PO intake. She was prescribed megace 93m BID and she has not taken megace regularly.   Denies any facial swelling, headache, double vision, shortness of breath. . She has mid back pain for which she takes hydrocodone every 4 hours as needed.  Usually she takes 2-3 pills a day.  Review of Systems  Constitutional: Positive for malaise/fatigue. Negative for chills, fever and weight loss.  HENT: Negative for hearing loss, nosebleeds and sinus pain.   Eyes: Negative for double vision, photophobia and discharge.  Respiratory: Negative for cough, hemoptysis and sputum production.   Cardiovascular: Negative for chest pain and claudication.  Gastrointestinal: Positive for nausea. Negative for abdominal pain, blood in stool, heartburn and vomiting.       Poor appetite.   Genitourinary: Negative for dysuria.  Musculoskeletal: Positive for back pain. Negative for myalgias.  Skin: Negative for rash.  Neurological: Negative for dizziness, sensory change, speech change and weakness.  Endo/Heme/Allergies: Does not bruise/bleed easily.  Psychiatric/Behavioral: Negative for depression and substance abuse.    MEDICAL HISTORY:  Past Medical History:  Diagnosis Date  . Hypertension   . Lung nodule 08/09/2017   Per PETscan order  SURGICAL HISTORY: Past Surgical History:  Procedure Laterality Date  . FLEXIBLE BRONCHOSCOPY N/A 07/27/2017   Procedure: FLEXIBLE BRONCHOSCOPY;  Surgeon: Wilhelmina Mcardle, MD;  Location: [facility] ORS;  Service: Pulmonary;  Laterality: N/A;  . OOPHORECTOMY Right     SOCIAL HISTORY: Social History   Socioeconomic History  . Marital status: Married    Spouse name: Not on file  . Number of children: Not on file  . Years of education: Not on file  . Highest education level: Not on file  Social Needs  . Financial resource strain: Not on file  . Food insecurity - worry: Not on file  . Food insecurity - inability: Not on file  . Transportation needs - medical: Not on file  . Transportation needs - non-medical: Not on file  Occupational History  . Not on file  Tobacco Use  . Smoking status: Never Smoker  . Smokeless tobacco: Never Used  Substance and Sexual Activity  . Alcohol use: Not on file  . Drug use: Not on file  . Sexual activity: Not on file  Other Topics Concern  . Not on file  Social History Narrative  . Not on file    FAMILY HISTORY: Family History  Problem Relation Age of Onset  . Breast cancer Mother        58's  . Breast cancer Maternal Aunt   . Ovarian cancer Maternal Aunt   . Colon cancer Sister   . Rectal cancer Brother   . Prostate cancer Brother     ALLERGIES:  is allergic to sertaconazole and statins.  MEDICATIONS:  Current Outpatient Medications  Medication Sig Dispense Refill  . aspirin EC 81 MG tablet Take 81 mg by mouth daily.    . carvedilol (COREG) 6.25 MG tablet Take 6.25 mg by mouth 2 (two) times daily with a meal.     . Cholecalciferol (VITAMIN D-1000 MAX ST) 1000 UNITS tablet Take 1,000 Units by mouth daily.     . citalopram (CELEXA) 20 MG tablet Take 20 mg by mouth daily.     . Cyanocobalamin (RA VITAMIN B-12 TR) 1000 MCG TBCR Take 1,000 mcg by mouth daily.     . fluticasone (FLONASE) 50 MCG/ACT nasal spray Place 1 spray into both nostrils at bedtime.     . gabapentin (NEURONTIN) 300 MG capsule Take 300 mg by mouth at bedtime.    Marland Kitchen losartan-hydrochlorothiazide (HYZAAR) 100-25 MG per tablet Take 1  tablet by mouth daily.     . megestrol (MEGACE) 40 MG tablet Take 2 tablets (80 mg total) by mouth 2 (two) times daily. 60 tablet 0  . simvastatin (ZOCOR) 20 MG tablet Take 10 mg by mouth daily.     Marland Kitchen acetaminophen (TYLENOL) 500 MG tablet Take 1,000 mg by mouth every 6 (six) hours as needed for moderate pain or headache.    . albuterol (VENTOLIN HFA) 108 (90 BASE) MCG/ACT inhaler Inhale 1 puff into the lungs every 6 (six) hours as needed for wheezing or shortness of breath.     Marland Kitchen HYDROcodone-acetaminophen (NORCO/VICODIN) 5-325 MG tablet Take 1 tablet by mouth every 6 (six) hours as needed for moderate pain. (Patient not taking: Reported on 08/10/2017) 30 tablet 0  . LORazepam (ATIVAN) 0.5 MG tablet Take 0.5 mg by mouth 3 (three) times daily as needed for anxiety.     . meloxicam (MOBIC) 15 MG tablet Take 15 mg by mouth daily.     . potassium chloride SA (K-DUR,KLOR-CON) 20 MEQ tablet  Take 1 tablet (20 mEq total) by mouth daily. (Patient not taking: Reported on 08/10/2017) 3 tablet 0   No current facility-administered medications for this visit.      PHYSICAL EXAMINATION: ECOG PERFORMANCE STATUS: 2 - Symptomatic, <50% confined to bed Vitals:   08/10/17 1505  BP: (!) 99/57  Pulse: 91  Resp: 18  Temp: (!) 96 F (35.6 C)   Filed Weights    Physical Exam  Constitutional: She is oriented to person, place, and time and well-developed, well-nourished, and in no distress. No distress.  HENT:  Head: Normocephalic and atraumatic.  Mouth/Throat: No oropharyngeal exudate.  Eyes: EOM are normal. Pupils are equal, round, and reactive to light. No scleral icterus.  Neck: Neck supple.  Cardiovascular: Normal rate and regular rhythm.  No murmur heard. Pulmonary/Chest: Effort normal and breath sounds normal. No respiratory distress. She has no wheezes. She has no rales.  Abdominal: Soft. Bowel sounds are normal. She exhibits no mass. There is no rebound.  Musculoskeletal: Normal range of motion. She  exhibits no edema.  Lymphadenopathy:    She has no cervical adenopathy.  Neurological: She is alert and oriented to person, place, and time. No cranial nerve deficit.  Skin: Skin is warm and dry. She is not diaphoretic. No erythema.  Psychiatric: Memory, affect and judgment normal.     LABORATORY DATA:  I have reviewed the data as listed Lab Results  Component Value Date   WBC 20.1 (H) 08/10/2017   HGB 10.6 (L) 08/10/2017   HCT 33.2 (L) 08/10/2017   MCV 77.7 (L) 08/10/2017   PLT 345 08/10/2017   Recent Labs    07/24/17 1505 08/02/17 1445 08/10/17 1603  NA  --  134* 133*  K  --  3.1* 3.1*  CL  --  91* 89*  CO2  --  29 31  GLUCOSE  --  145* 120*  BUN  --  16 19  CREATININE 1.20* 1.14* 1.69*  CALCIUM  --  9.2 8.9  GFRNONAA  --  46* 28*  GFRAA  --  53* 33*  PROT  --  7.5 7.1  ALBUMIN  --  3.2* 3.1*  AST  --  20 19  ALT  --  16 13*  ALKPHOS  --  70 68  BILITOT  --  0.8 0.6       ASSESSMENT & PLAN:  1. Malignant neoplasm of lung, unspecified laterality, unspecified part of lung ([place])   2. AKI (acute kidney injury) ([place])   3. Dehydration   4. Iron deficiency anemia, unspecified iron deficiency anemia type   5. Goals of care, counseling/discussion    I had a lengthy discussion with patient and her family members regarding the diagnosis of high-grade pleomorphic carcinoma. PET scan results were discussed with patient. She has metastatic disease involving bilateral adrenal gland, and bone.  MRI brain is scheduled next Monday.  PD-L is 95% Depends her performance status, will offer either Carbo/Taxol, hoping to obtain response quickly or single agent Keytruda.  May not tolerates triplet.  Will reassess her on 08/13/2016.   # Chemotherapy class, port placement.  # goal of care is palliative, discussed with patient and family member, they understand that her disease is not curable.  # check if pathology has enough tissue to send studies to determine origin of the  carcinoma.  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  Her case was discussed on tumor board on  08/02/2017.  # Poor oral intake, will obtain CBC and CMP today.  Since she is the last patient of the afternoon, cancer center can not add on hydration session. Patient is reluctant to go to ER to get hydration. She is willing to try oral hydration first. I advise her to take 64 oz of fluid per day.  Also suggest taking Ensure BID for nutrition.  Advise patient to start taking Megace 63m BID. Patient follows up on 3/18 for evaluation and hydration if indicated.   # Borderline blood pressure: She is on Coreg and Hyzaar and has already taken today's dose. Advise patient to hold BP medication and measure her BP at home. If Systolic BP is <<518 she should continue to hold her blood pressure.  If blood pressure continues to be low, she should go to ER or urgent care for further management. Patient and family voice understanding.   # Labs reviewed, consistent with dehydration, hemoconcentration. Plan discussed with patient.(see above).   # Iron deficiency anemia, decreased iron saturation 8%.  Will discuss IV iron at the next visit.  All questions were answered. The patient knows to call the clinic with any problems questions or concerns.  Return of visit: After intact and a brain MRI. Thank you for this kind referral and the opportunity to participate in the care of this patient. A copy of today's note is routed to referring provider  Total face to face encounter time for this patient visit was 45 min. >50% of the time was  spent in counseling and coordination of care.   ZEarlie Server MD, PhD Hematology Oncology [facility] Hospital[facility]ager- [phone]

## 2017-08-12 NOTE — Addendum Note (Signed)
Addended by: Earlie Server on: 08/12/2017 12:02 PM   Modules accepted: Orders

## 2017-08-13 ENCOUNTER — Encounter: Payer: Self-pay | Admitting: Emergency Medicine

## 2017-08-13 ENCOUNTER — Emergency Department: Payer: Medicare PPO

## 2017-08-13 ENCOUNTER — Inpatient Hospital Stay
Admission: EM | Admit: 2017-08-13 | Discharge: 2017-08-27 | DRG: 064 | Disposition: E | Payer: Medicare PPO | Attending: Pulmonary Disease | Admitting: Pulmonary Disease

## 2017-08-13 ENCOUNTER — Ambulatory Visit: Admission: RE | Admit: 2017-08-13 | Payer: Medicare PPO | Source: Ambulatory Visit | Admitting: Radiation Oncology

## 2017-08-13 ENCOUNTER — Encounter: Payer: Self-pay | Admitting: Oncology

## 2017-08-13 ENCOUNTER — Other Ambulatory Visit: Payer: Self-pay

## 2017-08-13 ENCOUNTER — Inpatient Hospital Stay (HOSPITAL_BASED_OUTPATIENT_CLINIC_OR_DEPARTMENT_OTHER): Payer: Medicare PPO | Admitting: Oncology

## 2017-08-13 ENCOUNTER — Inpatient Hospital Stay: Payer: Medicare PPO

## 2017-08-13 ENCOUNTER — Other Ambulatory Visit: Payer: Self-pay | Admitting: Oncology

## 2017-08-13 ENCOUNTER — Ambulatory Visit
Admission: RE | Admit: 2017-08-13 | Discharge: 2017-08-13 | Disposition: A | Discharge status: Home / Self Care | Payer: Medicare PPO | Source: Ambulatory Visit | Attending: Oncology | Admitting: Oncology

## 2017-08-13 VITALS — BP 123/71 | HR 110 | Temp 97.7°F

## 2017-08-13 DIAGNOSIS — E860 Dehydration: Secondary | ICD-10-CM | POA: Diagnosis not present

## 2017-08-13 DIAGNOSIS — E876 Hypokalemia: Secondary | ICD-10-CM | POA: Diagnosis present

## 2017-08-13 DIAGNOSIS — E871 Hypo-osmolality and hyponatremia: Secondary | ICD-10-CM | POA: Diagnosis present

## 2017-08-13 DIAGNOSIS — Z90721 Acquired absence of ovaries, unilateral: Secondary | ICD-10-CM

## 2017-08-13 DIAGNOSIS — I4892 Unspecified atrial flutter: Secondary | ICD-10-CM

## 2017-08-13 DIAGNOSIS — D72829 Elevated white blood cell count, unspecified: Secondary | ICD-10-CM

## 2017-08-13 DIAGNOSIS — C3491 Malignant neoplasm of unspecified part of right bronchus or lung: Secondary | ICD-10-CM

## 2017-08-13 DIAGNOSIS — R630 Anorexia: Secondary | ICD-10-CM | POA: Diagnosis present

## 2017-08-13 DIAGNOSIS — E119 Type 2 diabetes mellitus without complications: Secondary | ICD-10-CM | POA: Diagnosis present

## 2017-08-13 DIAGNOSIS — I959 Hypotension, unspecified: Secondary | ICD-10-CM | POA: Diagnosis present

## 2017-08-13 DIAGNOSIS — F419 Anxiety disorder, unspecified: Secondary | ICD-10-CM | POA: Diagnosis present

## 2017-08-13 DIAGNOSIS — C3490 Malignant neoplasm of unspecified part of unspecified bronchus or lung: Secondary | ICD-10-CM

## 2017-08-13 DIAGNOSIS — J189 Pneumonia, unspecified organism: Secondary | ICD-10-CM | POA: Diagnosis present

## 2017-08-13 DIAGNOSIS — R0902 Hypoxemia: Secondary | ICD-10-CM

## 2017-08-13 DIAGNOSIS — C7951 Secondary malignant neoplasm of bone: Secondary | ICD-10-CM | POA: Diagnosis present

## 2017-08-13 DIAGNOSIS — E875 Hyperkalemia: Secondary | ICD-10-CM | POA: Diagnosis present

## 2017-08-13 DIAGNOSIS — W1830XA Fall on same level, unspecified, initial encounter: Secondary | ICD-10-CM | POA: Diagnosis present

## 2017-08-13 DIAGNOSIS — Z515 Encounter for palliative care: Secondary | ICD-10-CM | POA: Diagnosis not present

## 2017-08-13 DIAGNOSIS — E8809 Other disorders of plasma-protein metabolism, not elsewhere classified: Secondary | ICD-10-CM | POA: Diagnosis present

## 2017-08-13 DIAGNOSIS — E669 Obesity, unspecified: Secondary | ICD-10-CM | POA: Diagnosis present

## 2017-08-13 DIAGNOSIS — S0012XA Contusion of left eyelid and periocular area, initial encounter: Secondary | ICD-10-CM | POA: Diagnosis present

## 2017-08-13 DIAGNOSIS — W19XXXA Unspecified fall, initial encounter: Secondary | ICD-10-CM | POA: Diagnosis present

## 2017-08-13 DIAGNOSIS — R911 Solitary pulmonary nodule: Secondary | ICD-10-CM

## 2017-08-13 DIAGNOSIS — Z888 Allergy status to other drugs, medicaments and biological substances status: Secondary | ICD-10-CM

## 2017-08-13 DIAGNOSIS — Z791 Long term (current) use of non-steroidal anti-inflammatories (NSAID): Secondary | ICD-10-CM

## 2017-08-13 DIAGNOSIS — I480 Paroxysmal atrial fibrillation: Secondary | ICD-10-CM | POA: Diagnosis present

## 2017-08-13 DIAGNOSIS — C3400 Malignant neoplasm of unspecified main bronchus: Secondary | ICD-10-CM

## 2017-08-13 DIAGNOSIS — R778 Other specified abnormalities of plasma proteins: Secondary | ICD-10-CM

## 2017-08-13 DIAGNOSIS — I639 Cerebral infarction, unspecified: Secondary | ICD-10-CM

## 2017-08-13 DIAGNOSIS — E872 Acidosis: Secondary | ICD-10-CM | POA: Diagnosis present

## 2017-08-13 DIAGNOSIS — I6389 Other cerebral infarction: Secondary | ICD-10-CM | POA: Diagnosis not present

## 2017-08-13 DIAGNOSIS — N179 Acute kidney failure, unspecified: Secondary | ICD-10-CM | POA: Diagnosis present

## 2017-08-13 DIAGNOSIS — R000 Tachycardia, unspecified: Secondary | ICD-10-CM | POA: Diagnosis present

## 2017-08-13 DIAGNOSIS — Z66 Do not resuscitate: Secondary | ICD-10-CM | POA: Diagnosis present

## 2017-08-13 DIAGNOSIS — R29704 NIHSS score 4: Secondary | ICD-10-CM | POA: Diagnosis present

## 2017-08-13 DIAGNOSIS — J9601 Acute respiratory failure with hypoxia: Secondary | ICD-10-CM | POA: Diagnosis present

## 2017-08-13 DIAGNOSIS — I248 Other forms of acute ischemic heart disease: Secondary | ICD-10-CM | POA: Diagnosis present

## 2017-08-13 DIAGNOSIS — Z803 Family history of malignant neoplasm of breast: Secondary | ICD-10-CM

## 2017-08-13 DIAGNOSIS — Z800 Family history of malignant neoplasm of digestive organs: Secondary | ICD-10-CM

## 2017-08-13 DIAGNOSIS — C7972 Secondary malignant neoplasm of left adrenal gland: Secondary | ICD-10-CM | POA: Diagnosis present

## 2017-08-13 DIAGNOSIS — R7989 Other specified abnormal findings of blood chemistry: Secondary | ICD-10-CM

## 2017-08-13 DIAGNOSIS — R748 Abnormal levels of other serum enzymes: Secondary | ICD-10-CM | POA: Diagnosis not present

## 2017-08-13 DIAGNOSIS — I6522 Occlusion and stenosis of left carotid artery: Secondary | ICD-10-CM | POA: Diagnosis present

## 2017-08-13 DIAGNOSIS — Z7189 Other specified counseling: Secondary | ICD-10-CM

## 2017-08-13 DIAGNOSIS — Z79891 Long term (current) use of opiate analgesic: Secondary | ICD-10-CM

## 2017-08-13 DIAGNOSIS — Z8042 Family history of malignant neoplasm of prostate: Secondary | ICD-10-CM

## 2017-08-13 DIAGNOSIS — I4891 Unspecified atrial fibrillation: Secondary | ICD-10-CM | POA: Diagnosis not present

## 2017-08-13 DIAGNOSIS — G9349 Other encephalopathy: Secondary | ICD-10-CM | POA: Diagnosis not present

## 2017-08-13 DIAGNOSIS — E785 Hyperlipidemia, unspecified: Secondary | ICD-10-CM | POA: Diagnosis present

## 2017-08-13 DIAGNOSIS — D509 Iron deficiency anemia, unspecified: Secondary | ICD-10-CM

## 2017-08-13 DIAGNOSIS — G9340 Encephalopathy, unspecified: Secondary | ICD-10-CM | POA: Diagnosis present

## 2017-08-13 DIAGNOSIS — J45909 Unspecified asthma, uncomplicated: Secondary | ICD-10-CM | POA: Diagnosis present

## 2017-08-13 DIAGNOSIS — I10 Essential (primary) hypertension: Secondary | ICD-10-CM | POA: Diagnosis present

## 2017-08-13 DIAGNOSIS — Z7982 Long term (current) use of aspirin: Secondary | ICD-10-CM

## 2017-08-13 DIAGNOSIS — Z79899 Other long term (current) drug therapy: Secondary | ICD-10-CM

## 2017-08-13 DIAGNOSIS — C7971 Secondary malignant neoplasm of right adrenal gland: Secondary | ICD-10-CM | POA: Diagnosis not present

## 2017-08-13 DIAGNOSIS — Z6841 Body Mass Index (BMI) 40.0 and over, adult: Secondary | ICD-10-CM

## 2017-08-13 DIAGNOSIS — Z8041 Family history of malignant neoplasm of ovary: Secondary | ICD-10-CM

## 2017-08-13 HISTORY — DX: Malignant (primary) neoplasm, unspecified: C80.1

## 2017-08-13 HISTORY — DX: Unspecified asthma, uncomplicated: J45.909

## 2017-08-13 HISTORY — DX: Type 2 diabetes mellitus without complications: E11.9

## 2017-08-13 LAB — CBC WITH DIFFERENTIAL/PLATELET
Basophils Absolute: 0.1 10*3/uL (ref 0–0.1)
Basophils Relative: 0 %
EOS ABS: 0.2 10*3/uL (ref 0–0.7)
EOS PCT: 1 %
HCT: 33.6 % — ABNORMAL LOW (ref 35.0–47.0)
Hemoglobin: 10.9 g/dL — ABNORMAL LOW (ref 12.0–16.0)
LYMPHS ABS: 1.8 10*3/uL (ref 1.0–3.6)
LYMPHS PCT: 7 %
MCH: 25.2 pg — AB (ref 26.0–34.0)
MCHC: 32.3 g/dL (ref 32.0–36.0)
MCV: 78 fL — AB (ref 80.0–100.0)
MONO ABS: 1.2 10*3/uL — AB (ref 0.2–0.9)
Monocytes Relative: 5 %
Neutro Abs: 21.3 10*3/uL — ABNORMAL HIGH (ref 1.4–6.5)
Neutrophils Relative %: 87 %
PLATELETS: 332 10*3/uL (ref 150–440)
RBC: 4.31 MIL/uL (ref 3.80–5.20)
RDW: 18.2 % — AB (ref 11.5–14.5)
WBC: 24.5 10*3/uL — AB (ref 3.6–11.0)

## 2017-08-13 LAB — COMPREHENSIVE METABOLIC PANEL
ALBUMIN: 2.8 g/dL — AB (ref 3.5–5.0)
ALT: 14 U/L (ref 14–54)
ALT: 15 U/L (ref 14–54)
AST: 18 U/L (ref 15–41)
AST: 19 U/L (ref 15–41)
Albumin: 2.9 g/dL — ABNORMAL LOW (ref 3.5–5.0)
Alkaline Phosphatase: 71 U/L (ref 38–126)
Alkaline Phosphatase: 73 U/L (ref 38–126)
Anion gap: 13 (ref 5–15)
Anion gap: 13 (ref 5–15)
BILIRUBIN TOTAL: 0.9 mg/dL (ref 0.3–1.2)
BUN: 16 mg/dL (ref 6–20)
BUN: 16 mg/dL (ref 6–20)
CHLORIDE: 86 mmol/L — AB (ref 101–111)
CHLORIDE: 86 mmol/L — AB (ref 101–111)
CO2: 31 mmol/L (ref 22–32)
CO2: 31 mmol/L (ref 22–32)
CREATININE: 0.98 mg/dL (ref 0.44–1.00)
CREATININE: 0.99 mg/dL (ref 0.44–1.00)
Calcium: 9.2 mg/dL (ref 8.9–10.3)
Calcium: 9.2 mg/dL (ref 8.9–10.3)
GFR calc Af Amer: >60 mL/min (ref >60)
GFR, EST NON AFRICAN AMERICAN: 55 mL/min — AB (ref >60)
GFR, EST NON AFRICAN AMERICAN: 54 mL/min — AB (ref >60)
GLUCOSE: 168 mg/dL — AB (ref 65–99)
Glucose, Bld: 150 mg/dL — ABNORMAL HIGH (ref 65–99)
POTASSIUM: 3.4 mmol/L — AB (ref 3.5–5.1)
POTASSIUM: 3.3 mmol/L — AB (ref 3.5–5.1)
Sodium: 130 mmol/L — ABNORMAL LOW (ref 135–145)
Sodium: 130 mmol/L — ABNORMAL LOW (ref 135–145)
Total Bilirubin: 1 mg/dL (ref 0.3–1.2)
Total Protein: 7.3 g/dL (ref 6.5–8.1)
Total Protein: 7.5 g/dL (ref 6.5–8.1)

## 2017-08-13 LAB — DIFFERENTIAL
BASOS PCT: 0 %
Basophils Absolute: 0.1 10*3/uL (ref 0–0.1)
EOS ABS: 0.3 10*3/uL (ref 0–0.7)
Eosinophils Relative: 1 %
Lymphocytes Relative: 7 %
Lymphs Abs: 1.9 10*3/uL (ref 1.0–3.6)
MONO ABS: 1.2 10*3/uL — AB (ref 0.2–0.9)
MONOS PCT: 5 %
Neutro Abs: 23.5 10*3/uL — ABNORMAL HIGH (ref 1.4–6.5)
Neutrophils Relative %: 87 %

## 2017-08-13 LAB — CBC
HEMATOCRIT: 35.2 % (ref 35.0–47.0)
Hemoglobin: 11.4 g/dL — ABNORMAL LOW (ref 12.0–16.0)
MCH: 25 pg — AB (ref 26.0–34.0)
MCHC: 32.2 g/dL (ref 32.0–36.0)
MCV: 77.5 fL — AB (ref 80.0–100.0)
Platelets: 340 10*3/uL (ref 150–440)
RBC: 4.54 MIL/uL (ref 3.80–5.20)
RDW: 18.4 % — AB (ref 11.5–14.5)
WBC: 26.9 10*3/uL — ABNORMAL HIGH (ref 3.6–11.0)

## 2017-08-13 LAB — TROPONIN I
Troponin I: 0.13 ng/mL (ref <0.03)
Troponin I: 0.1 ng/mL (ref <0.03)
Troponin I: 0.08 ng/mL (ref <0.03)

## 2017-08-13 LAB — PROTIME-INR
INR: 1.2
Prothrombin Time: 15.1 seconds (ref 11.4–15.2)

## 2017-08-13 LAB — APTT: aPTT: 29 seconds (ref 24–36)

## 2017-08-13 MED ORDER — ASPIRIN 81 MG PO CHEW
324.0000 mg | CHEWABLE_TABLET | Freq: Once | ORAL | Status: AC
  Administered 2017-08-13: 324 mg via ORAL
  Filled 2017-08-13: qty 4

## 2017-08-13 MED ORDER — FENTANYL CITRATE (PF) 100 MCG/2ML IJ SOLN
50.0000 ug | Freq: Once | INTRAMUSCULAR | Status: AC
  Administered 2017-08-13: 50 ug via INTRAVENOUS
  Filled 2017-08-13: qty 2

## 2017-08-13 MED ORDER — ACETAMINOPHEN 650 MG RE SUPP: 650.0000 mg | RECTAL | Status: DC | PRN

## 2017-08-13 MED ORDER — ENOXAPARIN SODIUM 40 MG/0.4ML ~~LOC~~ SOLN: 40.0000 mg | SUBCUTANEOUS | Status: DC

## 2017-08-13 MED ORDER — ACETAMINOPHEN 160 MG/5ML PO SOLN
650.0000 mg | ORAL | Status: DC | PRN
  Filled 2017-08-13: qty 20.3

## 2017-08-13 MED ORDER — ALBUTEROL SULFATE (2.5 MG/3ML) 0.083% IN NEBU: 3.0000 mL | INHALATION_SOLUTION | Freq: Four times a day (QID) | RESPIRATORY_TRACT | Status: DC | PRN

## 2017-08-13 MED ORDER — CITALOPRAM HYDROBROMIDE 20 MG PO TABS
20.0000 mg | ORAL_TABLET | Freq: Every day | ORAL | Status: DC
  Administered 2017-08-13: 20 mg via ORAL
  Filled 2017-08-13: qty 1

## 2017-08-13 MED ORDER — LOSARTAN POTASSIUM 50 MG PO TABS
100.0000 mg | ORAL_TABLET | Freq: Every day | ORAL | Status: DC
  Administered 2017-08-13: 100 mg via ORAL
  Filled 2017-08-13: qty 2

## 2017-08-13 MED ORDER — POTASSIUM CHLORIDE CRYS ER 20 MEQ PO TBCR
40.0000 meq | EXTENDED_RELEASE_TABLET | ORAL | Status: AC
  Administered 2017-08-13 (×2): 40 meq via ORAL
  Filled 2017-08-13 (×2): qty 2

## 2017-08-13 MED ORDER — LORAZEPAM 0.5 MG PO TABS
0.5000 mg | ORAL_TABLET | Freq: Three times a day (TID) | ORAL | Status: DC | PRN
Start: 2017-08-13 — End: 2017-08-15

## 2017-08-13 MED ORDER — ACETAMINOPHEN 325 MG PO TABS
650.0000 mg | ORAL_TABLET | ORAL | Status: DC | PRN
  Filled 2017-08-13: qty 2

## 2017-08-13 MED ORDER — HYDROCHLOROTHIAZIDE 25 MG PO TABS
25.0000 mg | ORAL_TABLET | Freq: Every day | ORAL | Status: DC
  Administered 2017-08-13: 16:00:00 25 mg via ORAL
  Filled 2017-08-13: qty 1

## 2017-08-13 MED ORDER — SODIUM CHLORIDE 0.9 % IV SOLN
INTRAVENOUS | Status: DC
  Administered 2017-08-13: 16:00:00 via INTRAVENOUS

## 2017-08-13 MED ORDER — DILTIAZEM HCL 30 MG PO TABS
30.0000 mg | ORAL_TABLET | Freq: Four times a day (QID) | ORAL | Status: DC
  Administered 2017-08-13 (×2): 30 mg via ORAL
  Filled 2017-08-13 (×2): qty 1

## 2017-08-13 MED ORDER — CARVEDILOL 6.25 MG PO TABS
6.2500 mg | ORAL_TABLET | Freq: Two times a day (BID) | ORAL | Status: DC
  Administered 2017-08-13: 6.25 mg via ORAL
  Filled 2017-08-13: qty 2

## 2017-08-13 MED ORDER — ENOXAPARIN SODIUM 40 MG/0.4ML ~~LOC~~ SOLN
40.0000 mg | Freq: Two times a day (BID) | SUBCUTANEOUS | Status: DC
  Administered 2017-08-13: 40 mg via SUBCUTANEOUS
  Filled 2017-08-13: qty 0.4

## 2017-08-13 MED ORDER — FLUTICASONE PROPIONATE 50 MCG/ACT NA SUSP
1.0000 | Freq: Every day | NASAL | Status: DC
  Administered 2017-08-13 – 2017-08-14 (×2): 1 via NASAL
  Filled 2017-08-13: qty 16

## 2017-08-13 MED ORDER — DILTIAZEM HCL 25 MG/5ML IV SOLN: 20.0000 mg | INTRAVENOUS | Status: DC | PRN

## 2017-08-13 MED ORDER — VITAMIN B-12 1000 MCG PO TABS
1000.0000 ug | ORAL_TABLET | Freq: Every day | ORAL | Status: DC
  Administered 2017-08-13: 16:00:00 1000 ug via ORAL
  Filled 2017-08-13: qty 1

## 2017-08-13 MED ORDER — VITAMIN D 1000 UNITS PO TABS
1000.0000 [IU] | ORAL_TABLET | Freq: Every day | ORAL | Status: DC
  Administered 2017-08-13: 16:00:00 1000 [IU] via ORAL
  Filled 2017-08-13: qty 1

## 2017-08-13 MED ORDER — MEGESTROL ACETATE 20 MG PO TABS
80.0000 mg | ORAL_TABLET | Freq: Two times a day (BID) | ORAL | Status: DC
  Administered 2017-08-13: 20:00:00 80 mg via ORAL
  Filled 2017-08-13: qty 2
  Filled 2017-08-13 (×3): qty 4

## 2017-08-13 MED ORDER — SIMVASTATIN 20 MG PO TABS
10.0000 mg | ORAL_TABLET | Freq: Every day | ORAL | Status: DC
  Administered 2017-08-13: 10 mg via ORAL
  Filled 2017-08-13: qty 1

## 2017-08-13 MED ORDER — IOPAMIDOL (ISOVUE-370) INJECTION 76%
100.0000 mL | Freq: Once | INTRAVENOUS | Status: AC | PRN
  Administered 2017-08-13: 100 mL via INTRAVENOUS

## 2017-08-13 MED ORDER — ASPIRIN EC 81 MG PO TBEC
81.0000 mg | DELAYED_RELEASE_TABLET | Freq: Every day | ORAL | Status: DC
  Filled 2017-08-13: qty 1

## 2017-08-13 MED ORDER — LOSARTAN POTASSIUM-HCTZ 100-25 MG PO TABS: 1.0000 | ORAL_TABLET | Freq: Every day | ORAL | Status: DC

## 2017-08-13 MED ORDER — DILTIAZEM HCL 25 MG/5ML IV SOLN
20.0000 mg | Freq: Once | INTRAVENOUS | Status: AC
  Administered 2017-08-13: 20 mg via INTRAVENOUS
  Filled 2017-08-13: qty 5

## 2017-08-13 MED ORDER — ONDANSETRON HCL 4 MG/2ML IJ SOLN
4.0000 mg | Freq: Once | INTRAMUSCULAR | Status: AC
  Administered 2017-08-13: 4 mg via INTRAVENOUS
  Filled 2017-08-13: qty 2

## 2017-08-13 MED ORDER — STROKE: EARLY STAGES OF RECOVERY BOOK
Freq: Once | Status: AC
  Administered 2017-08-13: 16:00:00

## 2017-08-13 MED ORDER — LEVOFLOXACIN IN D5W 500 MG/100ML IV SOLN
500.0000 mg | INTRAVENOUS | Status: DC
  Administered 2017-08-13 – 2017-08-14 (×2): 500 mg via INTRAVENOUS
  Filled 2017-08-13 (×3): qty 100

## 2017-08-13 NOTE — Progress Notes (Addendum)
Hematology/Oncology Consult note [facility] Telephone:([phone] [phone]   Patient Care Team: Baxter Hire, MD as PCP - General (Internal Medicine) Telford Nab, RN as Registered Nurse  REFERRING PROVIDER: [facility]/ Dr.Simond  REASON FOR VISIT Follow up for treatment of lung carcinoma  HISTORY OF PRESENTING ILLNESS:  Carrie Marquez is a  77 y.o.  female with PMH listed below who was referred to me for evaluation of lung carcinoma.  Patient is a never smoker.  She has insidious onset back pain for which she underwent image workup and with incidental finding of a large right lung mass.  Patient denies any shortness of breath at rest.  She did mention that she started to feel shortness of breath with exertion lately.  Denies any symptoms suggestive of SVC syndrome. CT2/26/ 2019 showed large right hilar suprahilar mass extending into the right upper lobe and the superior aspect of the right middle lobe consistent with primary lung carcinoma.  Mass extends through the anterior pleura into the medial right chest wall.  The right suprahilar mass measures 43 x 63 mm, appears to compress are probably directly invade the superior vena cava, and possibly right main pulmonary artery.  She also has a small nodular opacity in the right lower lobe, and another small lesion in the left lower lobe.  Upper abdomen showed small soft tissue nodules posterior to the right lobe of liver, and bilateral adrenal lesions.  No suspicious bone lesions was mentioned .  Patient was seen and evaluated by pulmonology Dr. Shawna Orleans.  Patient underwent biopsy via bronchoscopy.  Pathology showed high-grade pleomorphic carcinoma.  A panel of the immunohistochemistry stain was performed with the following pattern: TTF-1 negative, Inapsine negative, S100 negative, P 40 negative ,Mucicarmine: Negative.  The differential diagnosis includes primary non-small cell lung cancer and  metastatic carcinoma.  Pathologist prefer to preserve the tissue for ancillary testing, unless PET scan suggest a primary site outside of the lung.  Patient's case was discussed on the multidisciplinary tumor conference on August 02, 2017. 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   INTERVAL HISTORY Carrie Marquez is a 77 y.o. female who has above history reviewed by me today presents for follow up visit for management of lung carcinoma.  She was accompanied by her husband.  She just had MRI of the brain in the morning.  Reports that she lost balance over the weekend and fell.  She had peri-orbital bruising.  Denies any focal weakness.  Denies any facial swelling, headache, double vision, shortness of breath.  She has been increasing her oral hydration since last visit.  Appetite still poor.  Denies any dysuria or increased frequency.  She says she had some mucus around her throat.   Review of Systems  Constitutional: Positive for malaise/fatigue. Negative for chills, fever and weight loss.  HENT: Negative for hearing loss, nosebleeds and sinus pain.   Eyes: Negative for double vision, photophobia and discharge.  Respiratory: Negative for cough, hemoptysis and sputum production.   Cardiovascular: Negative for chest pain and claudication.  Gastrointestinal: Negative for abdominal pain, blood in stool, heartburn, nausea and vomiting.       Poor appetite.   Genitourinary: Negative for dysuria.  Musculoskeletal: Positive for back pain. Negative for myalgias.  Skin: Negative for rash.  Neurological: Negative for dizziness, sensory change, speech change and weakness.  Endo/Heme/Allergies: Does not bruise/bleed easily.  Psychiatric/Behavioral: Negative for depression and  substance abuse.    MEDICAL HISTORY:  Past Medical History:  Diagnosis Date  . Asthma   . Cancer ([place])   . Diabetes mellitus without complication ([place])   . Hypertension     . Iron deficiency anemia 08/12/2017  . Lung nodule 08/09/2017   Per PETscan order    SURGICAL HISTORY: Past Surgical History:  Procedure Laterality Date  . FLEXIBLE BRONCHOSCOPY N/A 07/27/2017   Procedure: FLEXIBLE BRONCHOSCOPY;  Surgeon: Wilhelmina Mcardle, MD;  Location: [facility] ORS;  Service: Pulmonary;  Laterality: N/A;  . OOPHORECTOMY Right     SOCIAL HISTORY: Social History   Socioeconomic History  . Marital status: Married    Spouse name: Not on file  . Number of children: Not on file  . Years of education: Not on file  . Highest education level: Not on file  Social Needs  . Financial resource strain: Not on file  . Food insecurity - worry: Not on file  . Food insecurity - inability: Not on file  . Transportation needs - medical: Not on file  . Transportation needs - non-medical: Not on file  Occupational History  . Not on file  Tobacco Use  . Smoking status: Never Smoker  . Smokeless tobacco: Never Used  Substance and Sexual Activity  . Alcohol use: No    Frequency: Never  . Drug use: No  . Sexual activity: Not on file  Other Topics Concern  . Not on file  Social History Narrative  . Not on file    FAMILY HISTORY: Family History  Problem Relation Age of Onset  . Breast cancer Mother        34's  . Breast cancer Maternal Aunt   . Ovarian cancer Maternal Aunt   . Colon cancer Sister   . Rectal cancer Brother   . Prostate cancer Brother     ALLERGIES:  is allergic to sertaconazole and statins.  MEDICATIONS:  No current facility-administered medications for this visit.    No current outpatient medications on file.   Facility-Administered Medications Ordered in Other Visits  Medication Dose Route Frequency Provider Last Rate Last Dose  . 0.9 %  sodium chloride infusion   Intravenous Continuous Epifanio Lesches, MD 50 mL/hr at 07/27/2017 1537    . acetaminophen (TYLENOL) tablet 650 mg  650 mg Oral Q4H PRN Epifanio Lesches, MD       Or  .  acetaminophen (TYLENOL) solution 650 mg  650 mg Per Tube Q4H PRN Epifanio Lesches, MD       Or  . acetaminophen (TYLENOL) suppository 650 mg  650 mg Rectal Q4H PRN Epifanio Lesches, MD      . albuterol (PROVENTIL) (2.5 MG/3ML) 0.083% nebulizer solution 3 mL  3 mL Inhalation Q6H PRN Epifanio Lesches, MD      . aspirin EC tablet 81 mg  81 mg Oral Daily Epifanio Lesches, MD      . carvedilol (COREG) tablet 6.25 mg  6.25 mg Oral BID WC Epifanio Lesches, MD   6.25 mg at 08/06/2017 1550  . cholecalciferol (VITAMIN D) tablet 1,000 Units  1,000 Units Oral Daily Epifanio Lesches, MD   1,000 Units at 08/09/2017 1538  . citalopram (CELEXA) tablet 20 mg  20 mg Oral Daily Epifanio Lesches, MD   20 mg at 08/22/2017 1538  . diltiazem (CARDIZEM) tablet 30 mg  30 mg Oral Q6H Epifanio Lesches, MD   30 mg at 07/29/2017 1716  . enoxaparin (LOVENOX) injection 40 mg  40 mg Subcutaneous  Q12H Epifanio Lesches, MD      . fluticasone (FLONASE) 50 MCG/ACT nasal spray 1 spray  1 spray Each Nare QHS Epifanio Lesches, MD      . losartan (COZAAR) tablet 100 mg  100 mg Oral Daily Epifanio Lesches, MD   100 mg at 08/17/2017 1538   And  . hydrochlorothiazide (HYDRODIURIL) tablet 25 mg  25 mg Oral Daily Epifanio Lesches, MD   25 mg at 08/07/2017 1537  . levofloxacin (LEVAQUIN) IVPB 500 mg  500 mg Intravenous Q24H Epifanio Lesches, MD   Stopped at 08/20/2017 1649  . LORazepam (ATIVAN) tablet 0.5 mg  0.5 mg Oral TID PRN Epifanio Lesches, MD      . megestrol (MEGACE) tablet 80 mg  80 mg Oral BID Epifanio Lesches, MD      . potassium chloride SA (K-DUR,KLOR-CON) CR tablet 40 mEq  40 mEq Oral Q4H Epifanio Lesches, MD   40 mEq at 07/27/2017 1537  . simvastatin (ZOCOR) tablet 10 mg  10 mg Oral QHS Epifanio Lesches, MD      . vitamin B-12 (CYANOCOBALAMIN) tablet 1,000 mcg  1,000 mcg Oral Daily Epifanio Lesches, MD   1,000 mcg at 08/26/2017 1538     PHYSICAL EXAMINATION: ECOG  PERFORMANCE STATUS: 2 - Symptomatic, <50% confined to bed Vitals:   08/14/17 0904  BP: 123/71  Pulse: (!) 110  Temp: 97.7 F (36.5 C)   Filed Weights    Physical Exam  Constitutional: She is oriented to person, place, and time and well-developed, well-nourished, and in no distress. No distress.  HENT:  Head: Normocephalic and atraumatic.  Mouth/Throat: Oropharynx is clear and moist. No oropharyngeal exudate.  Eyes: EOM are normal. Pupils are equal, round, and reactive to light. No scleral icterus.  Neck: Neck supple. No JVD present.  Cardiovascular: Normal rate and regular rhythm.  No murmur heard. Pulmonary/Chest: Effort normal and breath sounds normal. No respiratory distress. She has no wheezes. She has no rales.  Abdominal: Soft. Bowel sounds are normal. She exhibits no mass. There is no tenderness. There is no rebound.  Musculoskeletal: Normal range of motion. She exhibits no edema.  Lymphadenopathy:    She has no cervical adenopathy.  Neurological: She is alert and oriented to person, place, and time. No cranial nerve deficit. She exhibits normal muscle tone. Coordination normal.  Skin: Skin is warm and dry. She is not diaphoretic. No erythema.  Psychiatric: Memory, affect and judgment normal.     LABORATORY DATA:  I have reviewed the data as listed Lab Results  Component Value Date   WBC 26.9 (H) 08/12/2017   HGB 11.4 (L) 08/20/2017   HCT 35.2 08/11/2017   MCV 77.5 (L) 08/18/2017   PLT 340 08/12/2017   Recent Labs    08/10/17 1603 08/11/2017 1133 08/07/2017 1245  NA 133* 130* 130*  K 3.1* 3.4* 3.3*  CL 89* 86* 86*  CO2 _0 GLUCOSE 120* 168* 150*  BUN _1 CREATININE 1.69* 0.98 0.99  CALCIUM 8.9 9.2 9.2  GFRNONAA 28* 55* 54*  GFRAA 33* >60 >60  PROT 7.1 7.3 7.5  ALBUMIN 3.1* 2.8* 2.9*  AST _2 ALT 13* 14 15  ALKPHOS 68 71 73  BILITOT 0.6 0.9 1.0       ASSESSMENT & PLAN:  1. Malignant neoplasm of lung, unspecified laterality,  unspecified part of lung ([place])   2. AKI (acute kidney injury) ([place])   3. Dehydration   4. Iron deficiency  anemia, unspecified iron deficiency anemia type   5. Goals of care, counseling/discussion   6. Leukocytosis, unspecified type   7. Cerebrovascular accident (CVA), unspecified mechanism ([place])    PD-L1 is 95%, with PS of 2, will offer single agent Keytruda.  May not tolerates triplet regimen.  # check if pathology has enough tissue to send studies to determine origin of the carcinoma.   # AKI improved.  # Acute/subacute stroked found on staging MRI, will send patient to ER for stroke work up. Called and spoke with ER Triage Nurse Nira Conn.  # Borderline blood pressure: BP improved with holding HTN medication.  # Iron deficiency anemia, decreased iron saturation 8%.  Will discuss IV iron at the next visit.  All questions were answered. The patient knows to call the clinic with any problems questions or concerns.  Return of visit: after discharge.  Thank you for this kind referral and the opportunity to participate in the care of this patient. A copy of today's note is routed to referring provider  .   Earlie Server, MD, PhD Hematology Oncology [facility] Pager- [phone]

## 2017-08-13 NOTE — Progress Notes (Signed)
  Oncology Nurse Navigator Documentation      )Navigator Encounter Type: Follow-up Appt (08/10/17 1600)                         Barriers/Navigation Needs: Coordination of Care (08/10/17 1600)   Interventions: Coordination of Care;Referrals (08/10/17 1600)   Coordination of Care: Appts;Chemo (08/10/17 1600)       met with patient and family during follow up visit to review PET results and further discuss treatment options. All questions answered at the time of visit. Dr. Tasia Catchings reviewed results of PET scan. Upcoming appts reviewed with patient and family. Given copies of PET results and PDL1 results. Informed pt that will be called with appt for [facility] placement. Pt and family verbalized understanding. Nothing further needed at this time.           Time Spent with Patient: 60 (08/10/17 1600)

## 2017-08-13 NOTE — ED Triage Notes (Signed)
Patient arrives from cancer center post having MRI this am. Mri states possible stroke. Patient states she was having MRI to check for metastasis. Denies headache or weakness.

## 2017-08-13 NOTE — Progress Notes (Signed)
Pharmacy Lovenox Dosing  77 y.o. female admitted with Cerebrovascular Accident . Patient ordered Lovenox 40 mg daily for VTE prophylaxis.   Filed Weights   08/11/2017 1237  Weight: 290 lb (131.5 kg)    Body mass index is 42.83 kg/m.  Estimated Creatinine Clearance: 71.5 mL/min (by C-G formula based on SCr of 0.99 mg/dL).  Will adjust Lovenox dosing to 40 mg bid for BMI > 40.  Ulice Dash D 08/25/2017 2:42 PM

## 2017-08-13 NOTE — ED Provider Notes (Signed)
[facility] Emergency Department Provider Note  ____________________________________________   First MD Initiated Contact with Patient 07/28/2017 1249     (approximate)  I have reviewed the triage vital signs and the nursing notes.   HISTORY  Chief Complaint Cerebrovascular Accident   HPI Carrie Marquez is a 77 y.o. female who comes to the emergency department from outpatient MRI for acute to subacute stroke noted.  The patient has a recent diagnosis of stage IV metastatic lung cancer.  She is received no drink, no radiation, no chemotherapy.  Today was an outpatient MRI to evaluate for possible brain metastases.  Her MRI noted no metastases but it was concerning for a new infarct.  The patient is felt generally more weak than usual.  About 24 hours ago she fell striking the left side of her face.  She denies double vision, slurred speech, numbness, weakness.  She does report increasing shortness of breath.  She had recent dehydration and decreased renal function so she has been drinking fluids aggressively.  Her shortness of breath is currently mild to moderate severity gradual onset insidious seems to be somewhat worse with exertion somewhat improved with rest.  Past Medical History:  Diagnosis Date  . Asthma   . Cancer ([place])   . Diabetes mellitus without complication ([place])   . Hypertension   . Iron deficiency anemia 08/12/2017  . Lung nodule 08/09/2017   Per PETscan order    Patient Active Problem List   Diagnosis Date Noted  . Paroxysmal atrial fibrillation (HCC)   . Demand ischemia ([place])   . Cerebrovascular accident (CVA) ([place])   . Malignant neoplasm of lung ([place])   . Acute ischemic stroke ([place]) 08/20/2017  . Iron deficiency anemia 08/12/2017  . Goals of care, counseling/discussion 08/12/2017  . Primary malignant neoplasm of right lung metastatic to other site ([place]) 08/12/2017  . Pernicious anemia 05/02/2017  . Asthma without status asthmaticus  12/31/2013  . Diabetes mellitus, type 2 ([place]) 12/31/2013  . HLD (hyperlipidemia) 12/31/2013  . BP (high blood pressure) 12/31/2013  . Adiposity 12/31/2013  . Spinal stenosis 12/31/2013    Past Surgical History:  Procedure Laterality Date  . FLEXIBLE BRONCHOSCOPY N/A 07/27/2017   Procedure: FLEXIBLE BRONCHOSCOPY;  Surgeon: Wilhelmina Mcardle, MD;  Location: [facility] ORS;  Service: Pulmonary;  Laterality: N/A;  . OOPHORECTOMY Right     Prior to Admission medications   Medication Sig Start Date End Date Taking? Authorizing Provider  acetaminophen (TYLENOL) 500 MG tablet Take 1,000 mg by mouth every 6 (six) hours as needed for moderate pain or headache.   Yes [provider]  albuterol (VENTOLIN HFA) 108 (90 BASE) MCG/ACT inhaler Inhale 1 puff into the lungs every 6 (six) hours as needed for wheezing or shortness of breath.    Yes [provider]  aspirin EC 81 MG tablet Take 81 mg by mouth at bedtime.    Yes [provider]  carvedilol (COREG) 6.25 MG tablet Take 6.25 mg by mouth 2 (two) times daily with a meal.  01/05/14  Yes [provider]  cholecalciferol (VITAMIN D) 1000 units tablet Take 1,000 Units by mouth daily.   Yes [provider]  citalopram (CELEXA) 20 MG tablet Take 20 mg by mouth at bedtime.    Yes [provider]  Cyanocobalamin (RA VITAMIN B-12 TR) 1000 MCG TBCR Take 1,000 mcg by mouth daily.    Yes [provider]  fluticasone (FLONASE) 50 MCG/ACT nasal spray Place 1  spray into both nostrils at bedtime.    Yes [provider]  gabapentin (NEURONTIN) 300 MG capsule Take 300 mg by mouth at bedtime. 12/01/16  Yes [provider]  HYDROcodone-acetaminophen (NORCO/VICODIN) 5-325 MG tablet Take 1 tablet by mouth every 6 (six) hours as needed for moderate pain. 08/02/17  Yes Earlie Server, MD  LORazepam (ATIVAN) 0.5 MG tablet Take 0.5 mg by mouth 3 (three) times daily as needed for anxiety.    Yes [provider]  losartan-hydrochlorothiazide (HYZAAR) 100-25 MG per tablet Take 1 tablet by mouth daily.  11/11/13  Yes [provider]  megestrol (MEGACE) 40 MG tablet Take 2 tablets (80 mg total) by mouth 2 (two) times daily. 08/08/17  Yes Earlie Server, MD  meloxicam (MOBIC) 15 MG tablet Take 15 mg by mouth daily as needed for pain.    Yes [provider]  ondansetron (ZOFRAN) 8 MG tablet Take 1 tablet (8 mg total) by mouth 2 (two) times daily as needed for refractory nausea / vomiting. Start on day 3 after carboplatin chemo. 08/12/17  Yes Earlie Server, MD  prochlorperazine (COMPAZINE) 10 MG tablet Take 1 tablet (10 mg total) by mouth every 6 (six) hours as needed (Nausea or vomiting). 08/12/17  Yes Earlie Server, MD  simvastatin (ZOCOR) 20 MG tablet Take 10 mg by mouth at bedtime.   Yes [provider]  lidocaine-prilocaine (EMLA) cream Apply to affected area once 08/12/17   Earlie Server, MD  potassium chloride SA (K-DUR,KLOR-CON) 20 MEQ tablet Take 1 tablet (20 mEq total) by mouth daily. Patient not taking: Reported on 08/10/2017 08/02/17   Earlie Server, MD    Allergies Sertaconazole and Statins  Family History  Problem Relation Age of Onset  . Breast cancer Mother        40's  . Breast cancer Maternal Aunt   . Ovarian cancer Maternal Aunt   . Colon cancer Sister   . Rectal cancer Brother   . Prostate cancer Brother     Social History Social History   Tobacco Use  . Smoking status: Never Smoker  . Smokeless tobacco: Never Used  Substance Use Topics  . Alcohol use: No    Frequency: Never  . Drug use: No    Review of Systems Constitutional: No fever/chills Eyes: No visual changes. ENT: No sore throat. Cardiovascular: Denies chest pain. Respiratory: Positive for shortness of breath. Gastrointestinal: No abdominal pain.  No nausea, no vomiting.  No diarrhea.  No constipation. Genitourinary: Negative for dysuria. Musculoskeletal: Negative for back pain. Skin: Negative for  rash. Neurological: Negative for headaches, focal weakness or numbness.   ____________________________________________   PHYSICAL EXAM:  VITAL SIGNS: ED Triage Vitals  Enc Vitals Group     BP 08/06/2017 1236 (!) 151/68     Pulse Rate 08/16/2017 1236 (!) 108     Resp 08/26/2017 1236 18     Temp 08/08/2017 1236 98 F (36.7 C)     Temp Source 08/11/2017 1236 Oral     SpO2 08/11/2017 1236 94 %     Weight 07/30/2017 1237 290 lb (131.5 kg)     Height 08/04/2017 1237 _0  (1.753 m)     Head Circumference --      Peak Flow --      Pain Score 08/23/2017 1237 5     Pain Loc --      Pain Edu? --      Excl. in [place]? --     Constitutional: Alert and  oriented x4 appears somewhat out of breath Eyes: PERRL EOMI. Head: Atraumatic. Nose: No congestion/rhinnorhea. Mouth/Throat: No trismus Neck: No stridor.   Cardiovascular: Tachycardic rate, regular rhythm. Grossly normal heart sounds.  Good peripheral circulation. Respiratory: Increased respiratory effort.  No retractions. Lungs CTAB and moving good air Gastrointestinal: Soft nontender Musculoskeletal: No lower extremity edema   Neurologic:  Normal speech and language.  CN 2-12 intact No pronator drift 5/5 HF/HE/PF/DF SILT all 4 Skin:  Skin is warm, dry and intact. No rash noted. Psychiatric: Mood and affect are normal. Speech and behavior are normal.   ____________________________________________   DIFFERENTIAL includes but not limited to  Stroke, intracerebral hemorrhage, pulmonary embolism, pneumonia ____________________________________________   LABS (all labs ordered are listed, but only abnormal results are displayed)  Labs Reviewed  CBC - Abnormal; Notable for the following components:      Result Value   WBC 26.9 (*)    Hemoglobin 11.4 (*)    MCV 77.5 (*)    MCH 25.0 (*)    RDW 18.4 (*)    All other components within normal limits  DIFFERENTIAL - Abnormal; Notable for the following components:   Neutro Abs 23.5 (*)    Monocytes  Absolute 1.2 (*)    All other components within normal limits  COMPREHENSIVE METABOLIC PANEL - Abnormal; Notable for the following components:   Sodium 130 (*)    Potassium 3.3 (*)    Chloride 86 (*)    Glucose, Bld 150 (*)    Albumin 2.9 (*)    GFR calc non Af Amer 54 (*)    All other components within normal limits  TROPONIN I - Abnormal; Notable for the following components:   Troponin I 0.13 (*)    All other components within normal limits  TROPONIN I - Abnormal; Notable for the following components:   Troponin I 0.10 (*)    All other components within normal limits  TROPONIN I - Abnormal; Notable for the following components:   Troponin I 0.08 (*)    All other components within normal limits  TROPONIN I - Abnormal; Notable for the following components:   Troponin I 0.10 (*)    All other components within normal limits  HEMOGLOBIN A1C - Abnormal; Notable for the following components:   Hgb A1c MFr Bld 6.3 (*)    All other components within normal limits  LIPID PANEL - Abnormal; Notable for the following components:   HDL 23 (*)    All other components within normal limits  GLUCOSE, CAPILLARY - Abnormal; Notable for the following components:   Glucose-Capillary 147 (*)    All other components within normal limits  GLUCOSE, CAPILLARY - Abnormal; Notable for the following components:   Glucose-Capillary 166 (*)    All other components within normal limits  BLOOD GAS, ARTERIAL - Abnormal; Notable for the following components:   pO2, Arterial 71 (*)    All other components within normal limits  BASIC METABOLIC PANEL - Abnormal; Notable for the following components:   Potassium 6.1 (*)    Chloride 94 (*)    CO2 33 (*)    Glucose, Bld 159 (*)    BUN 24 (*)    Creatinine, Ser 1.87 (*)    GFR calc non Af Amer 25 (*)    GFR calc Af Amer 29 (*)    All other components within normal limits  CBC - Abnormal; Notable for the following components:   WBC 32.0 (*)    Hemoglobin  10.6 (*)    HCT 34.6 (*)    MCH 24.8 (*)    MCHC 30.6 (*)    RDW 18.8 (*)    All other components within normal limits  GLUCOSE, CAPILLARY - Abnormal; Notable for the following components:   Glucose-Capillary 160 (*)    All other components within normal limits  MRSA PCR SCREENING  PROTIME-INR  APTT  PROCALCITONIN  URINALYSIS, COMPLETE (UACMP) WITH MICROSCOPIC  PROCALCITONIN  TSH    Lab work reviewed by me with elevated white count raising concern for infection __________________________________________  EKG  ED ECG REPORT I, Darel Hong, the attending physician, personally viewed and interpreted this ECG.  Date: 08/23/2017 EKG Time:  Rate: 109 Rhythm: Sinus tachycardia QRS Axis: Left axis deviation Intervals: normal ST/T Wave abnormalities: normal Narrative Interpretation: no evidence of acute ischemia  ____________________________________________  RADIOLOGY  CT angiogram reviewed by me with no evidence of pulmonary embolism but is concerning for pneumonia ____________________________________________   PROCEDURES  Procedure(s) performed: no  .Critical Care Performed by: Darel Hong, MD Authorized by: Darel Hong, MD   Critical care provider statement:    Critical care time (minutes):  30   Critical care time was exclusive of:  Separately billable procedures and treating other patients   Critical care was necessary to treat or prevent imminent or life-threatening deterioration of the following conditions:  Respiratory failure and CNS failure or compromise   Critical care was time spent personally by me on the following activities:  Development of treatment plan with patient or surrogate, discussions with consultants, evaluation of patient's response to treatment, examination of patient, obtaining history from patient or surrogate, ordering and performing treatments and interventions, ordering and review of laboratory studies, ordering and review of  radiographic studies, pulse oximetry, re-evaluation of patient's condition and review of old charts     Critical Care performed: Yes Observation: no ____________________________________________   INITIAL IMPRESSION / ASSESSMENT AND PLAN / ED COURSE  Pertinent labs & imaging results that were available during my care of the patient were reviewed by me and considered in my medical decision making (see chart for details).  Patient comes in with an NIH score of 0 and a nonfocal neurological exam.  Her outpatient MRI does show a 5 mm right frontal acute to subacute infarct.  She is hypoxic to 84% on room air which is new for her with elevated respiratory rate of unclear etiology.  Chest x-ray is pending.  I have a call out to neurology now.  324 mg of aspirin as she has no acute bleed.     ________----------------------------------------- 1:54 PM on 08/09/2017 -----------------------------------------  I discussed the case with on-call neurologist Dr. Doy Mince who agrees with inpatient admission and 324 mg of aspirin.  More concerning than her stroke right now is actually her acute hypoxia.  She has a respiratory rate of 30 and hypoxic to 84%.  Differential is broad but includes pneumonia, obstruction, and pulmonary embolism.  The patient will require inpatient admission secondary to hypoxia regardless but CT angiogram of the chest is pending.  ____________________________________   CT angiogram shows no evidence of pulmonary embolism but is concerning for pneumonia so I will initiate the patient on broad-spectrum antibiotics.  At this point as she has increasing oxygen requirements she does require inpatient admission for continued oxygen supplementation and IV antibiotics.  The patient and her family verbalized understanding and agreement with the plan.  I then discussed with the hospitalist who has graciously agreed to admit  the patient to their service.  FINAL CLINICAL IMPRESSION(S) / ED  DIAGNOSES  Final diagnoses:  Cerebrovascular accident (CVA), unspecified mechanism ([place])  Elevated troponin  Hypoxemia      NEW MEDICATIONS STARTED DURING THIS VISIT:  Current Discharge Medication List       Note:  This document was prepared using Dragon voice recognition software and may include unintentional dictation errors.     Darel Hong, MD 08/14/17 [id number]

## 2017-08-13 NOTE — ED Notes (Signed)
Patient transported to CT 

## 2017-08-13 NOTE — ED Notes (Signed)
Pt states that she does not wear O2 at home but found out today that she has lung cancer that is growing into her "wind pipe" - pt c/o shortness of breath and O2 sat is 85% - placed on 2L of O2 via n/c

## 2017-08-13 NOTE — H&P (Signed)
[facility] NAME: Carrie Marquez    MR#:  [id number]  DATE OF BIRTH:  June 13, 1940  DATE OF ADMISSION:  07/28/2017  PRIMARY CARE PHYSICIAN: Baxter Hire, MD   REQUESTING/REFERRING PHYSICIAN: Dr. Corky Downs  CHIEF COMPLAINT:abnormal MRI   Chief Complaint  Patient presents with  . Cerebrovascular Accident    HISTORY OF PRESENT ILLNESS:  Carrie Marquez  is a 77 y.o. 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  Patient had MRI of the brain to look for metastasis but it showed acute right frontal stroke.  Patient denies any complaints except that she had a fall last night and she felt dizzy.  Patient has no weakness, no slurred speech.  Follows up with Dr. 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  Supposed to get palliative chemotherapy.  Pertinent to husband patient is not eating or drinking anything recently.  Started on Megace by Dr. Tasia Catchings.  Patient also has shortness of breath, right now she is on 3 L of oxygen and sats are around 99%.  PAST MEDICAL HISTORY:   Past Medical History:  Diagnosis Date  . Asthma   . Cancer ([place])   . Diabetes mellitus without complication ([place])   . Hypertension   . Iron deficiency anemia 08/12/2017  . Lung nodule 08/09/2017   Per PETscan order    PAST SURGICAL HISTOIRY:   Past Surgical History:  Procedure Laterality Date  . FLEXIBLE BRONCHOSCOPY N/A 07/27/2017   Procedure: FLEXIBLE BRONCHOSCOPY;  Surgeon: Wilhelmina Mcardle, MD;  Location: [facility] ORS;  Service: Pulmonary;  Laterality: N/A;  . OOPHORECTOMY Right     SOCIAL HISTORY:   Social History   Tobacco Use  . Smoking status: Never Smoker  . Smokeless tobacco: Never Used  Substance Use Topics  . Alcohol use: Not on file    FAMILY HISTORY:   Family History  Problem  Relation Age of Onset  . Breast cancer Mother        87's  . Breast cancer Maternal Aunt   . Ovarian cancer Maternal Aunt   . Colon cancer Sister   . Rectal cancer Brother   . Prostate cancer Brother     DRUG ALLERGIES:   Allergies  Allergen Reactions  . Sertaconazole Other (See Comments)  . Statins Other (See Comments)    myalgias    REVIEW OF SYSTEMS:  CONSTITUTIONAL: Fatigue, generalized weakness, shortness of breath, poor appetite.Marland Kitchen  EYES: No blurred or double vision.  EARS, NOSE, AND THROAT: No tinnitus or ear pain.  RESPIRATORY: No cough but has shortness of breath.  CARDIOVASCULAR: No chest pain, orthopnea, edema.  GASTROINTESTINAL: No nausea, vomiting, diarrhea or abdominal pain.  GENITOURINARY: No dysuria, hematuria.  ENDOCRINE: No polyuria, nocturia,  HEMATOLOGY: No anemia, easy bruising or bleeding SKIN: No rash or lesion. MUSCULOSKELETAL: No joint pain or arthritis.   NEUROLOGIC: No tingling, numbness, weakness.  PSYCHIATRY: No anxiety or depression.   MEDICATIONS AT HOME:   Prior to Admission medications   Medication Sig Start Date End Date Taking? Authorizing Provider  acetaminophen (TYLENOL) 500 MG tablet Take 1,000 mg by mouth every 6 (six) hours as needed for moderate pain or headache.   Yes [provider]  albuterol (VENTOLIN HFA) 108 (90 BASE) MCG/ACT inhaler Inhale 1 puff into the lungs every 6 (  six) hours as needed for wheezing or shortness of breath.    Yes [provider]  aspirin EC 81 MG tablet Take 81 mg by mouth at bedtime.    Yes [provider]  carvedilol (COREG) 6.25 MG tablet Take 6.25 mg by mouth 2 (two) times daily with a meal.  01/05/14  Yes [provider]  cholecalciferol (VITAMIN D) 1000 units tablet Take 1,000 Units by mouth daily.   Yes [provider]  citalopram (CELEXA) 20 MG tablet Take 20 mg by mouth at bedtime.    Yes [provider]  Cyanocobalamin (RA VITAMIN B-12 TR) 1000  MCG TBCR Take 1,000 mcg by mouth daily.    Yes [provider]  fluticasone (FLONASE) 50 MCG/ACT nasal spray Place 1 spray into both nostrils at bedtime.    Yes [provider]  gabapentin (NEURONTIN) 300 MG capsule Take 300 mg by mouth at bedtime. 12/01/16  Yes [provider]  HYDROcodone-acetaminophen (NORCO/VICODIN) 5-325 MG tablet Take 1 tablet by mouth every 6 (six) hours as needed for moderate pain. 08/02/17  Yes Earlie Server, MD  LORazepam (ATIVAN) 0.5 MG tablet Take 0.5 mg by mouth 3 (three) times daily as needed for anxiety.    Yes [provider]  losartan-hydrochlorothiazide (HYZAAR) 100-25 MG per tablet Take 1 tablet by mouth daily.  11/11/13  Yes [provider]  megestrol (MEGACE) 40 MG tablet Take 2 tablets (80 mg total) by mouth 2 (two) times daily. 08/08/17  Yes Earlie Server, MD  meloxicam (MOBIC) 15 MG tablet Take 15 mg by mouth daily as needed for pain.    Yes [provider]  ondansetron (ZOFRAN) 8 MG tablet Take 1 tablet (8 mg total) by mouth 2 (two) times daily as needed for refractory nausea / vomiting. Start on day 3 after carboplatin chemo. 08/12/17  Yes Earlie Server, MD  prochlorperazine (COMPAZINE) 10 MG tablet Take 1 tablet (10 mg total) by mouth every 6 (six) hours as needed (Nausea or vomiting). 08/12/17  Yes Earlie Server, MD  simvastatin (ZOCOR) 20 MG tablet Take 10 mg by mouth at bedtime.   Yes [provider]  lidocaine-prilocaine (EMLA) cream Apply to affected area once 08/12/17   Earlie Server, MD  potassium chloride SA (K-DUR,KLOR-CON) 20 MEQ tablet Take 1 tablet (20 mEq total) by mouth daily. Patient not taking: Reported on 08/10/2017 08/02/17   Earlie Server, MD      VITAL SIGNS:  Blood pressure (!) 155/69, pulse (!) 109, temperature 98 F (36.7 C), resp. rate (!) 24, height _0  (1.753 m), weight 131.5 kg (290 lb), SpO2 96 %.  PHYSICAL EXAMINATION:  GENERAL:  77 y.o.-year-old patient lying in the bed with no acute distress.   Noted to have  Left black eye. EYES: Pupils equal, round, reactive to light  . No scleral icterus. Extraocular muscles intact.  HEENT: Head atraumatic, normocephalic. Oropharynx and nasopharynx clear.  NECK:  Supple, no jugular venous distention. No thyroid enlargement, no tenderness.  LUNGS: Normal breath sounds bilaterally, no wheezing, rales,rhonchi or crepitation. No use of accessory muscles of respiration.  CARDIOVASCULAR: S1, S2 normal. No murmurs, rubs, or gallops.  ABDOMEN: Soft, nontender, nondistended. Bowel sounds present. No organomegaly or mass.  EXTREMITIES: No pedal edema, cyanosis, or clubbing.  NEUROLOGIC: Cranial nerves II through XII are intact. Muscle strength 5/5 in all extremities. Sensation intact. Gait not checked.  PSYCHIATRIC: The patient is alert and oriented x 3.  SKIN: No obvious rash, lesion, or ulcer.  LABORATORY PANEL:   CBC Recent Labs  Lab 08/17/2017 1245  WBC 26.9*  HGB 11.4*  HCT 35.2  PLT 340   ------------------------------------------------------------------------------------------------------------------  Chemistries  Recent Labs  Lab 08/10/17 1603  08/12/2017 1245  NA 133*   < > 130*  K 3.1*   < > 3.3*  CL 89*   < > 86*  CO2 31   < > 31  GLUCOSE 120*   < > 150*  BUN 19   < > 16  CREATININE 1.69*   < > 0.99  CALCIUM 8.9   < > 9.2  MG 2.0  --   --   AST 19   < > 19  ALT 13*   < > 15  ALKPHOS 68   < > 73  BILITOT 0.6   < > 1.0   < > = values in this interval not displayed.   ------------------------------------------------------------------------------------------------------------------  Cardiac Enzymes Recent Labs  Lab 08/19/2017 1245  TROPONINI 0.13*   ------------------------------------------------------------------------------------------------------------------  RADIOLOGY:  Dg Chest 2 View  Result Date: 08/23/2017 CLINICAL DATA:  Shortness of breath and hypoxia. EXAM: CHEST - 2 VIEW COMPARISON:  CT and PET scan  FINDINGS: Heart size is normal. Large right hilar and suprahilar mass with right upper lobe volume loss. Left lung is clear. Small amount of pleural fluid on the right. No acute bone finding. IMPRESSION: Similar appearance to the previous studies. Right hilar and suprahilar mass with right upper lobe volume loss and small amount of pleural fluid on the right. Electronically Signed   By: Nelson Chimes M.D.   On: 08/24/2017 13:45   Mr Brain Wo Contrast  Result Date: 08/12/2017 CLINICAL DATA:  Recently diagnosed lung cancer. IV contrast was not administered due to diminished renal function (GFR of 29). EXAM: MRI HEAD WITHOUT CONTRAST TECHNIQUE: Multiplanar, multiecho pulse sequences of the brain and surrounding structures were obtained without intravenous contrast. COMPARISON:  None. FINDINGS: Brain: There is a 5 mm focus of restricted diffusion in the subcortical white matter of the posteromedial right frontal lobe (series 5, image 35) with mild T2/FLAIR hyperintensity suggestive of an acute/early acute small vessel infarct. No acute infarct is identified elsewhere. No intracranial mass lesion or vasogenic edema is identified to suggest metastatic disease, however lack of IV contrast reduces sensitivity for detection of small lesions. Patchy T2 hyperintensities in the subcortical and deep cerebral white matter and pons are nonspecific but compatible with mild-to-moderate chronic small vessel ischemic disease. 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 The ventricles and sulci are normal for age. Vascular: Major intracranial vascular flow voids are preserved. Skull and upper cervical spine: No suspicious marrow lesion. Sinuses/Orbits: Bilateral cataract extraction. Complex left maxillary sinus fluid. Clear mastoid air cells. Other: None. IMPRESSION: 1. No definite evidence of intracranial metastases on this unenhanced examination. Short-term follow-up brain MRI with  contrast is recommended when the patient's renal function improves. 2. 5 mm focus of restricted diffusion in the right frontal lobe most consistent with acute/early subacute infarct. 3. Mild-to-moderate chronic small vessel ischemic disease. Electronically Signed   By: Logan Bores M.D.   On: 08/18/2017 10:52    EKG:   Orders placed or performed during the hospital encounter of 08/19/2017  . ED EKG  . ED EKG    IMPRESSION AND PLAN:   77 year old female patient with multiple medical problems of recently diagnosed metastatic lung cancer has acute stroke. 1.  Acute to subacute right frontal  stroke: Patient initial NIH stroke scale 0, nonfocal neurological exam.  Admit to stroke unit, patient already on aspirin, statins, check echocardiogram, ultrasound of carotids, obtain neurology consult, PT evaluation, stroke swallow screen, monitor on daily. 2.  Hypoxia and tachycardia; CT angios chest did not show PE.  But it showed posterior upper lobe pneumonia, large right perihilar mass encasing the bronchus and right pulmonary artery, her hypoxia is also coming from large perihilar mass encasing the right main bronchus, right pulmonary artery.  Empirically started on antibiotics, consult oncology Dr. Tasia Catchings regarding chemotherapy options. #3 .anorexia: Started on Megace. #4. hypokalemia, hyponatremia, started on gentle hydration. 5.  Slightly elevated troponins: Likely secondary to demand ischemia from her right lung cancer, shortness of breath.  Cycle the troponins.  Continue aspirin, Coreg.  Patient already is getting echocardiogram. Prognosis  is poor secondary to metastatic lung cancer. D/w husband All the records are reviewed and case discussed with ED provider. Management plans discussed with the patient, family and they are in agreement.  CODE STATUS: full  TOTAL TIME TAKING CARE OF THIS PATIENT: 55 minutes.    Epifanio Lesches M.D on 08/14/2017 at 2:48 PM  Between 7am to 6pm - Pager -  [phone]  After 6pm go to [email or web address] - password [email or web address] [facility]  Office  [phone]  CC: Primary care physician; Baxter Hire, MD  Note: This dictation was prepared with Dragon dictation along with smaller phrase technology. Any transcriptional errors that result from this process are unintentional.

## 2017-08-13 NOTE — ED Notes (Signed)
Attempted to call report. RN in pt room and will call back

## 2017-08-13 NOTE — Progress Notes (Signed)
   08/09/2017 1715  Clinical Encounter Type  Visited With Patient and family together  Visit Type Initial  Referral From Nurse  Consult/Referral To Chaplain  Spiritual Encounters  Spiritual Needs Other (Comment)   [place] received an OR to talk with PT about an AD. CH reported to PT's RM and talked with both the PT and her husband and daughter. The PT appeared to have some confusion. Husband said that PT has an AD and he will bring a copy in for the chart. [place] will follow up as needed.

## 2017-08-14 ENCOUNTER — Inpatient Hospital Stay: Payer: Medicare PPO

## 2017-08-14 ENCOUNTER — Other Ambulatory Visit (INDEPENDENT_AMBULATORY_CARE_PROVIDER_SITE_OTHER): Payer: Self-pay | Admitting: Vascular Surgery

## 2017-08-14 DIAGNOSIS — I639 Cerebral infarction, unspecified: Secondary | ICD-10-CM

## 2017-08-14 DIAGNOSIS — G9349 Other encephalopathy: Secondary | ICD-10-CM

## 2017-08-14 DIAGNOSIS — C7971 Secondary malignant neoplasm of right adrenal gland: Secondary | ICD-10-CM

## 2017-08-14 DIAGNOSIS — I480 Paroxysmal atrial fibrillation: Secondary | ICD-10-CM

## 2017-08-14 DIAGNOSIS — C3490 Malignant neoplasm of unspecified part of unspecified bronchus or lung: Secondary | ICD-10-CM

## 2017-08-14 DIAGNOSIS — R0902 Hypoxemia: Secondary | ICD-10-CM

## 2017-08-14 DIAGNOSIS — C7972 Secondary malignant neoplasm of left adrenal gland: Secondary | ICD-10-CM

## 2017-08-14 DIAGNOSIS — E872 Acidosis: Secondary | ICD-10-CM

## 2017-08-14 DIAGNOSIS — C3491 Malignant neoplasm of unspecified part of right bronchus or lung: Secondary | ICD-10-CM

## 2017-08-14 DIAGNOSIS — C7951 Secondary malignant neoplasm of bone: Secondary | ICD-10-CM

## 2017-08-14 DIAGNOSIS — I4891 Unspecified atrial fibrillation: Secondary | ICD-10-CM

## 2017-08-14 DIAGNOSIS — I248 Other forms of acute ischemic heart disease: Secondary | ICD-10-CM

## 2017-08-14 DIAGNOSIS — I6389 Other cerebral infarction: Secondary | ICD-10-CM

## 2017-08-14 LAB — LIPID PANEL
Cholesterol: 131 mg/dL (ref 0–200)
HDL: 23 mg/dL — AB (ref >40)
LDL Cholesterol: 86 mg/dL (ref 0–99)
TRIGLYCERIDES: 109 mg/dL (ref <150)
Total CHOL/HDL Ratio: 5.7 RATIO
VLDL: 22 mg/dL (ref 0–40)

## 2017-08-14 LAB — MRSA PCR SCREENING: MRSA by PCR: NEGATIVE

## 2017-08-14 LAB — BASIC METABOLIC PANEL
Anion gap: 8 (ref 5–15)
BUN: 24 mg/dL — AB (ref 6–20)
CALCIUM: 8.9 mg/dL (ref 8.9–10.3)
CO2: 33 mmol/L — ABNORMAL HIGH (ref 22–32)
CREATININE: 1.87 mg/dL — AB (ref 0.44–1.00)
Chloride: 94 mmol/L — ABNORMAL LOW (ref 101–111)
GFR calc Af Amer: 29 mL/min — ABNORMAL LOW (ref >60)
GFR, EST NON AFRICAN AMERICAN: 25 mL/min — AB (ref >60)
GLUCOSE: 159 mg/dL — AB (ref 65–99)
Potassium: 6.1 mmol/L — ABNORMAL HIGH (ref 3.5–5.1)
SODIUM: 135 mmol/L (ref 135–145)

## 2017-08-14 LAB — GLUCOSE, CAPILLARY
GLUCOSE-CAPILLARY: 147 mg/dL — AB (ref 65–99)
GLUCOSE-CAPILLARY: 166 mg/dL — AB (ref 65–99)
GLUCOSE-CAPILLARY: 160 mg/dL — AB (ref 65–99)

## 2017-08-14 LAB — CBC
HEMATOCRIT: 34.6 % — AB (ref 35.0–47.0)
Hemoglobin: 10.6 g/dL — ABNORMAL LOW (ref 12.0–16.0)
MCH: 24.8 pg — ABNORMAL LOW (ref 26.0–34.0)
MCHC: 30.6 g/dL — AB (ref 32.0–36.0)
MCV: 80.9 fL (ref 80.0–100.0)
PLATELETS: 435 10*3/uL (ref 150–440)
RBC: 4.28 MIL/uL (ref 3.80–5.20)
RDW: 18.8 % — AB (ref 11.5–14.5)
WBC: 32 10*3/uL — AB (ref 3.6–11.0)

## 2017-08-14 LAB — PROCALCITONIN: PROCALCITONIN: 0.35 ng/mL

## 2017-08-14 LAB — TROPONIN I: Troponin I: 0.1 ng/mL (ref <0.03)

## 2017-08-14 LAB — HEMOGLOBIN A1C
HEMOGLOBIN A1C: 6.3 % — AB (ref 4.8–5.6)
Mean Plasma Glucose: 134.11 mg/dL

## 2017-08-14 MED ORDER — CEFAZOLIN SODIUM-DEXTROSE 2-4 GM/100ML-% IV SOLN
2.0000 g | Freq: Once | INTRAVENOUS | Status: AC
  Administered 2017-08-14: 2 g via INTRAVENOUS
  Filled 2017-08-14: qty 100

## 2017-08-14 MED ORDER — APIXABAN 5 MG PO TABS: 5.0000 mg | ORAL_TABLET | Freq: Two times a day (BID) | ORAL | Status: DC

## 2017-08-14 MED ORDER — DILTIAZEM LOAD VIA INFUSION
10.0000 mg | Freq: Once | INTRAVENOUS | Status: AC
  Administered 2017-08-14: 10 mg via INTRAVENOUS
  Filled 2017-08-14: qty 10

## 2017-08-14 MED ORDER — DEXTROSE 50 % IV SOLN
1.0000 | Freq: Once | INTRAVENOUS | Status: AC
  Administered 2017-08-14: 50 mL via INTRAVENOUS
  Filled 2017-08-14: qty 50

## 2017-08-14 MED ORDER — SODIUM CHLORIDE 0.9 % IV BOLUS (SEPSIS): 500.0000 mL | Freq: Once | INTRAVENOUS | Status: DC

## 2017-08-14 MED ORDER — DEXTROSE 5 % IV SOLN
INTRAVENOUS | Status: DC
  Administered 2017-08-14 (×2): via INTRAVENOUS
  Filled 2017-08-14 (×5): qty 150

## 2017-08-14 MED ORDER — SODIUM CHLORIDE 0.9 % IV BOLUS (SEPSIS)
1000.0000 mL | Freq: Once | INTRAVENOUS | Status: AC
  Administered 2017-08-14: 1000 mL via INTRAVENOUS

## 2017-08-14 MED ORDER — PHENYLEPHRINE HCL 10 MG/ML IJ SOLN
0.0000 ug/min | INTRAMUSCULAR | Status: DC
  Administered 2017-08-14: 300 ug/min via INTRAVENOUS
  Administered 2017-08-14: 200 ug/min via INTRAVENOUS
  Administered 2017-08-14: 350 ug/min via INTRAVENOUS
  Administered 2017-08-14: 400 ug/min via INTRAVENOUS
  Administered 2017-08-14: 300 ug/min via INTRAVENOUS
  Administered 2017-08-14: 350 ug/min via INTRAVENOUS
  Administered 2017-08-14 (×2): 225 ug/min via INTRAVENOUS
  Administered 2017-08-14: 30 ug/min via INTRAVENOUS
  Administered 2017-08-14: 275 ug/min via INTRAVENOUS
  Administered 2017-08-15: 150 ug/min via INTRAVENOUS
  Administered 2017-08-15: 100 ug/min via INTRAVENOUS
  Administered 2017-08-15: 175 ug/min via INTRAVENOUS
  Filled 2017-08-14 (×2): qty 10
  Filled 2017-08-14: qty 1
  Filled 2017-08-14 (×2): qty 10
  Filled 2017-08-14: qty 1
  Filled 2017-08-14: qty 10
  Filled 2017-08-14 (×7): qty 1
  Filled 2017-08-14: qty 10
  Filled 2017-08-14: qty 1
  Filled 2017-08-14: qty 10

## 2017-08-14 MED ORDER — DILTIAZEM HCL 100 MG IV SOLR
5.0000 mg/h | INTRAVENOUS | Status: DC
  Administered 2017-08-14 (×2): 5 mg/h via INTRAVENOUS
  Filled 2017-08-14 (×2): qty 100

## 2017-08-14 MED ORDER — INSULIN ASPART 100 UNIT/ML IV SOLN
10.0000 [IU] | Freq: Once | INTRAVENOUS | Status: AC
  Administered 2017-08-14: 10 [IU] via INTRAVENOUS
  Filled 2017-08-14 (×2): qty 0.1

## 2017-08-14 MED ORDER — INSULIN ASPART 100 UNIT/ML ~~LOC~~ SOLN
SUBCUTANEOUS | Status: AC
  Administered 2017-08-14: 10 [IU] via INTRAVENOUS
  Filled 2017-08-14: qty 1

## 2017-08-14 NOTE — Progress Notes (Signed)
OT Cancellation Note  Patient Details Name: Carrie Marquez MRN: [id number] DOB: 1940/10/11   Cancelled Treatment:    Reason Eval/Treat Not Completed: Medical issues which prohibited therapy;Patient not medically ready. Consult received and chart reviewed.  Patient with noted decline in status and recent transfer to CCU.  Per policy, will require new orders to resume services. Please re-consult as medically appropriate.  Jeni Salles, MPH, MS, OTR/L ascom [phone] 08/14/17, 2:42 PM

## 2017-08-14 NOTE — Progress Notes (Signed)
[facility] [place] NAME: Carrie Marquez    MR#:  [id number]  DATE OF BIRTH:  1941-02-01  SUBJECTIVE:  CHIEF COMPLAINT:   Chief Complaint  Patient presents with  . Cerebrovascular Accident    brought with acute / sub acute stroke, had A fib last night- moved to Tele. Now more lethargic this morning, still arousable and oriented when awake.  REVIEW OF SYSTEMS:  CONSTITUTIONAL: No fever,positive for fatigue or weakness.  EYES: No blurred or double vision.  EARS, NOSE, AND THROAT: No tinnitus or ear pain.  RESPIRATORY: No cough, shortness of breath, wheezing or hemoptysis.  CARDIOVASCULAR: No chest pain, orthopnea, edema.  GASTROINTESTINAL: No nausea, vomiting, diarrhea or abdominal pain.  GENITOURINARY: No dysuria, hematuria.  ENDOCRINE: No polyuria, nocturia,  HEMATOLOGY: No anemia, easy bruising or bleeding SKIN: No rash or lesion. MUSCULOSKELETAL: No joint pain or arthritis.   NEUROLOGIC: No tingling, numbness,generalized weakness.  PSYCHIATRY: No anxiety or depression.   ROS  DRUG ALLERGIES:   Allergies  Allergen Reactions  . Sertaconazole Other (See Comments)  . Statins Other (See Comments)    myalgias    VITALS:  Blood pressure (!) 91/44, pulse 67, temperature (!) 95.8 F (35.4 C), temperature source Tympanic, resp. rate (!) 24, height 5\' 9"  (1.753 m), weight 131.1 kg (289 lb 1.6 oz), SpO2 96 %.  PHYSICAL EXAMINATION:  GENERAL:  77 y.o.-year-old patient lying in the bed with no acute distress.  EYES: Pupils equal, round, reactive to light and accommodation. No scleral icterus. Extraocular muscles intact.  HEENT: Head atraumatic, normocephalic. Oropharynx and nasopharynx clear.  NECK:  Supple, no jugular venous distention. No thyroid enlargement, no tenderness.  LUNGS: Normal breath sounds bilaterally, no wheezing, rales,rhonchi or crepitation. No use of accessory muscles of respiration.  CARDIOVASCULAR: S1, S2 normal. No  murmurs, rubs, or gallops.  ABDOMEN: Soft, nontender, nondistended. Bowel sounds present. No organomegaly or mass.  EXTREMITIES: No pedal edema, cyanosis, or clubbing.  NEUROLOGIC: arousable, talking few words , no gross cranial nerves abnormalities, moves all limbs slowly, very weak. PSYCHIATRIC: The patient is drowsy but arousable. SKIN: No obvious rash, lesion, or ulcer.   Physical Exam LABORATORY PANEL:   CBC Recent Labs  Lab 08/14/17 1458  WBC 32.0*  HGB 10.6*  HCT 34.6*  PLT 435   ------------------------------------------------------------------------------------------------------------------  Chemistries  Recent Labs  Lab 08/10/17 1603  08/08/2017 1245 08/14/17 1458  NA 133*   < > 130* 135  K 3.1*   < > 3.3* 6.1*  CL 89*   < > 86* 94*  CO2 31   < > 31 33*  GLUCOSE 120*   < > 150* 159*  BUN 19   < > 16 24*  CREATININE 1.69*   < > 0.99 1.87*  CALCIUM 8.9   < > 9.2 8.9  MG 2.0  --   --   --   AST 19   < > 19  --   ALT 13*   < > 15  --   ALKPHOS 68   < > 73  --   BILITOT 0.6   < > 1.0  --    < > = values in this interval not displayed.   ------------------------------------------------------------------------------------------------------------------  Cardiac Enzymes Recent Labs  Lab 08/14/2017 2109 08/14/17 0322  TROPONINI 0.08* 0.10*   ------------------------------------------------------------------------------------------------------------------  RADIOLOGY:  Dg Chest 2 View  Result Date: 08/26/2017 CLINICAL DATA:  Shortness of breath and hypoxia. EXAM: CHEST - 2 VIEW  COMPARISON:  CT and PET scan FINDINGS: Heart size is normal. Large right hilar and suprahilar mass with right upper lobe volume loss. Left lung is clear. Small amount of pleural fluid on the right. No acute bone finding. IMPRESSION: Similar appearance to the previous studies. Right hilar and suprahilar mass with right upper lobe volume loss and small amount of pleural fluid on the right.  Electronically Signed   By: Nelson Chimes M.D.   On: 08/01/2017 13:45   Ct Angio Chest Pe W/cm &/or Wo Cm  Result Date: 07/27/2017 CLINICAL DATA:  Shortness of breath and weakness.  Lung cancer. EXAM: CT ANGIOGRAPHY CHEST WITH CONTRAST TECHNIQUE: Multidetector CT imaging of the chest was performed using the standard protocol during bolus administration of intravenous contrast. Multiplanar CT image reconstructions and MIPs were obtained to evaluate the vascular anatomy. CONTRAST:  132mL ISOVUE-370 IOPAMIDOL (ISOVUE-370) INJECTION 76% COMPARISON:  PET CT 08/09/2017 FINDINGS: Cardiovascular: Contrast injection is sufficient to demonstrate satisfactory opacification of the pulmonary arteries to the segmental level. There is no pulmonary embolus. The main pulmonary artery is mildly enlarged, measuring 3.2 cm. There is a normal 3-vessel arch branching pattern without evidence of acute aortic syndrome. There is mildaortic atherosclerosis. Heart size is normal, without pericardial effusion. Mediastinum/Nodes: Multiple right paratracheal nodes measuring up to 1.9 cm. No axillary lymphadenopathy. Lungs/Pleura: Confluent right parahilar mass encases the right mainstem bronchus and the proximal aspect of the right upper lobe bronchus and bronchus intermedius. The size of the mass measures 7.5 x 5.6 cm, previously 7.0 x 5.0 cm. The right pulmonary artery is also encased. There is severe narrowing of the right upper lobar artery. There is a region of confluent opacity in the posterior right upper lobe measuring up to 7 cm. Small right pleural effusion. Pulmonary nodules within the right lung measure up to 9 x 6 mm. Nodules in the left lung measure up to 10 x 11 mm. Upper Abdomen: Contrast bolus timing is not optimized for evaluation of the abdominal organs. Right adrenal mass measures 2.8 x 2.2 cm, but is incompletely visualized. Incompletely visualized left adrenal mass measures up to 5.6 x 4.2 cm. The remainder of the  visible upper abdominal organs are normal. Musculoskeletal: Suspected osseous metastases are better characterized on the recent PET CT. There are no large lytic or blastic lesions. Review of the MIP images confirms the above findings. IMPRESSION: 1. No pulmonary embolus or acute aortic syndrome. 2. Large right parahilar mass encasing the right mainstem bronchus and right pulmonary artery with severe narrowing of the right upper lobar artery. Slight increase in size of the mass may be due to slice selection. 3. Multiple nodular pulmonary metastases within both lungs. 4. Area of consolidation in the posterior right upper lobe, concerning for pneumonia. 5. Bilateral adrenal metastases, left-greater-than-right. 6. Osseous metastases suggested on the PET CT are not visible on this scan. 7.  Aortic Atherosclerosis (ICD10-I70.0). Electronically Signed   By: Ulyses Jarred M.D.   On: 08/10/2017 14:48   Mr Brain Wo Contrast  Result Date: 08/14/2017 CLINICAL DATA:  Lung cancer with known metastatic disease to the adrenal glands. Abnormal MRI brain 08/13/2016. EXAM: MRI HEAD WITHOUT CONTRAST TECHNIQUE: Multiplanar, multiecho pulse sequences of the brain and surrounding structures were obtained without intravenous contrast. COMPARISON:  MRI brain 08/13/2016. FINDINGS: Brain: A focal area of restricted diffusion is again noted within the posteromedial right frontal lobe along the cingulate gyrus. There is no significant interval change. No additional lesions are present. There is increased  patient motion on today's scan compared to the scan from yesterday. Remote lacunar infarcts are present in the basal ganglia. Periventricular and subcortical T2 changes bilaterally are stable. White matter changes extend into the brainstem cerebellum is unremarkable. Vascular: Normal flow voids. Skull and upper cervical spine: The skull base is within normal limits. The craniocervical junction is normal. Decreased marrow signal in the upper  cervical spine is concerning for metastatic disease. Sinuses/Orbits: A fluid level is present in the left maxillary sinus. A remote left orbital blowout fracture is present. Paranasal sinuses are otherwise clear. Globes and orbits are otherwise within normal limits. Bilateral lens replacements are noted. IMPRESSION: 1. Stable focal area of restricted diffusion involving the posteromedial right frontal lobe measuring less than 10 mm. Findings are compatible with acute/subacute infarct involving the SMA. Neurologic sequela may be difficult to detect. 2. Stable atrophy and advanced white matter disease. This likely reflects the sequela of chronic microvascular ischemia. 3. No other new or acute lesions to suggest metastatic disease to the brain. Electronically Signed   By: San Morelle M.D.   On: 08/14/2017 13:18   Mr Brain Wo Contrast  Result Date: 08/11/2017 CLINICAL DATA:  Recently diagnosed lung cancer. IV contrast was not administered due to diminished renal function (GFR of 29). EXAM: MRI HEAD WITHOUT CONTRAST TECHNIQUE: Multiplanar, multiecho pulse sequences of the brain and surrounding structures were obtained without intravenous contrast. COMPARISON:  None. FINDINGS: Brain: There is a 5 mm focus of restricted diffusion in the subcortical white matter of the posteromedial right frontal lobe (series 5, image 35) with mild T2/FLAIR hyperintensity suggestive of an acute/early acute small vessel infarct. No acute infarct is identified elsewhere. No intracranial mass lesion or vasogenic edema is identified to suggest metastatic disease, however lack of IV contrast reduces sensitivity for detection of small lesions. Patchy T2 hyperintensities in the subcortical and deep cerebral white matter and pons are nonspecific but compatible with mild-to-moderate chronic small vessel ischemic disease. 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 The ventricles and sulci are  normal for age. Vascular: Major intracranial vascular flow voids are preserved. Skull and upper cervical spine: No suspicious marrow lesion. Sinuses/Orbits: Bilateral cataract extraction. Complex left maxillary sinus fluid. Clear mastoid air cells. Other: None. IMPRESSION: 1. No definite evidence of intracranial metastases on this unenhanced examination. Short-term follow-up brain MRI with contrast is recommended when the patient's renal function improves. 2. 5 mm focus of restricted diffusion in the right frontal lobe most consistent with acute/early subacute infarct. 3. Mild-to-moderate chronic small vessel ischemic disease. Electronically Signed   By: Logan Bores M.D.   On: 08/20/2017 10:52   US Carotid Bilateral (at [facility] Only)  Result Date: 08/14/2017 CLINICAL DATA:  77 year old female with acute right frontal lobe mini-stroke. EXAM: BILATERAL CAROTID DUPLEX ULTRASOUND TECHNIQUE: Pearline Cables scale imaging, color Doppler and duplex ultrasound were performed of bilateral carotid and vertebral arteries in the neck. COMPARISON:  Brain MRI 07/30/2017 FINDINGS: Criteria: Quantification of carotid stenosis is based on velocity parameters that correlate the residual internal carotid diameter with NASCET-based stenosis levels, using the diameter of the distal internal carotid lumen as the denominator for stenosis measurement. The following velocity measurements were obtained: RIGHT ICA:  109/53 cm/sec CCA:  009/38 cm/sec SYSTOLIC ICA/CCA RATIO:  0.8 DIASTOLIC ICA/CCA RATIO:  1.2 ECA:  135 cm/sec LEFT ICA:  141/66 cm/sec CCA:  182/99 cm/sec SYSTOLIC ICA/CCA RATIO:  1.1 DIASTOLIC ICA/CCA RATIO:  1.7 ECA:  96 cm/sec RIGHT CAROTID ARTERY: No significant atherosclerotic plaque or evidence of stenosis. RIGHT VERTEBRAL ARTERY:  Patent with normal antegrade flow. LEFT CAROTID ARTERY: Focal heterogeneous atherosclerotic plaque in the proximal internal carotid artery. By peak systolic velocity criteria, the estimated stenosis  falls in the 50-69% diameter range. LEFT VERTEBRAL ARTERY:  Patent with normal antegrade flow. IMPRESSION: 1. 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 2. No significant atherosclerotic plaque or evidence of stenosis in the right internal carotid artery. 3. The vertebral arteries are patent with normal antegrade flow. 4. Tachycardic cardiac rhythm noted on Doppler tracings. Signed, Criselda Peaches, MD [facility] Electronically Signed   By: Jacqulynn Cadet M.D.   On: 08/14/2017 07:47    ASSESSMENT AND PLAN:   Active Problems:   Primary malignant neoplasm of right lung metastatic to other site [facility])   Acute ischemic stroke (HCC)   Paroxysmal atrial fibrillation (HCC)   Demand ischemia (HCC)   Cerebrovascular accident (CVA) ([place])   Malignant neoplasm of lung ([place])  77 year old female patient with multiple medical problems of recently diagnosed metastatic lung cancer has acute stroke. *  Acute to subacute right frontal stroke:  already on aspirin, statins, check echocardiogram, ultrasound of carotids, obtain neurology consult, PT evaluation, stroke swallow screen,   there is worsening in mental status- repeat MRI. *  Hypoxia and tachycardia; CT angios chest did not show PE.  But it showed posterior upper lobe pneumonia, large right perihilar mass encasing the bronchus and right pulmonary artery, her hypoxia is also coming from large perihilar mass encasing the right main bronchus, right pulmonary artery.  Empirically started on antibiotics, levaquin consult oncology Dr. Tasia Catchings regarding chemotherapy options. * altered mental status-  Will get repeat MRI to r/o new stroke. * A fib with RVR- cardizem drip, tele, cardio consult   Anticoaulant as advised by cardio and neuro. * .anorexia: Started on Megace. *. hypokalemia, hyponatremia,   on gentle hydration. *   Slightly elevated troponins: Likely secondary to demand ischemia from her right lung cancer, shortness of breath.  remained stable on Cycle the troponins.  Continue aspirin, Coreg.  Patient already is getting echocardiogram.  Prognosis  is poor secondary to metastatic lung cancer. D/w husband, daughter, brother in room.   All the records are reviewed and case discussed with Care Management/Social Workerr. Management plans discussed with the patient, family and they are in agreement.  CODE STATUS: Full.  TOTAL TIME TAKING CARE OF THIS PATIENT: 40 minutes.    POSSIBLE D/C IN 2-3 DAYS, DEPENDING ON CLINICAL CONDITION.   Vaughan Basta M.D on 08/14/2017   Between 7am to 6pm - Pager - [phone]  After 6pm go to [email or web address] - password [email or web address] [facility]  Office  [phone]  CC: Primary care physician; Baxter Hire, MD  Note: This dictation was prepared with Dragon dictation along with smaller phrase technology. Any transcriptional errors that result from this process are unintentional.

## 2017-08-14 NOTE — Evaluation (Signed)
Clinical/Bedside Swallow Evaluation Patient Details  Name: Carrie Marquez MRN: [id number] Date of Birth: Oct 09, 1940  Today's Date: 08/14/2017 Time: SLP Start Time (ACUTE ONLY): 1045 SLP Stop Time (ACUTE ONLY): 1145 SLP Time Calculation (min) (ACUTE ONLY): 60 min  Past Medical History:  Past Medical History:  Diagnosis Date  . Asthma   . Cancer ([place])   . Diabetes mellitus without complication ([place])   . Hypertension   . Iron deficiency anemia 08/12/2017  . Lung nodule 08/09/2017   Per PETscan order   Past Surgical History:  Past Surgical History:  Procedure Laterality Date  . FLEXIBLE BRONCHOSCOPY N/A 07/27/2017   Procedure: FLEXIBLE BRONCHOSCOPY;  Surgeon: Wilhelmina Mcardle, MD;  Location: [facility] ORS;  Service: Pulmonary;  Laterality: N/A;  . OOPHORECTOMY Right    HPI:  Pt is a 77 y.o. female with a known history of obesity, asthma, DM, HTN, and recently diagnosed metastatic non-small cell cancer of the lung brought in because of abnormal MRI of the brain.  Patient had MRI of the brain to look for metastasis but it showed acute right frontal stroke.  Patient denies any complaints except that she had a fall last night and she felt dizzy.  Patient has no weakness, no slurred speech.  Follows up with Dr. 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  Supposed to get palliative chemotherapy.  Pertinent to husband patient is not eating or drinking anything recently.  Started on Megace by Dr. Tasia Catchings.  Patient also has shortness of breath, right now she is on 3 L of oxygen and sats are around 99%. Pt was initially talkative and alert at admission. Overnight, she developed new onset Afib/flutter with RVR with heart rates into the 140s bpm. She was started on a Cardizem gtt for rate control. She has since converted to sinus rhythm. Echo is pending. Husband notes the patient is much more somnolent this morning and just appears "out of it."     Assessment / Plan / Recommendation Clinical Impression  Pt appears to be at increased risk for aspiration of any oral intake secondary to her increased drowsy/lethargic state; pt required mod-max verbal/tactile stim to awaken to participate in po task. Pt required ongoing verbal/tactile stimulation to maintain alertness during trials of ice chips. While awake/alert to task, pt was able to complete single trials of ice chips w/ no immediate, overt s/s of aspiration noted. However, oral phase was c/b slow oral motor movements for bolus manipulation and management, even slow opening of mouth to take po's despite verbal/tactile cues. Pharyngeal swallow palpated and appeared slightly decreased in excursion - unsure if related to effort and drowsiness. Due to pt's dowsy/lethargic presentation and increased risk for aspiration, no further po trials were given at this eval. Educated and demonstrated use of oral swabs to family present in room and encouraged family to use these frequently during the day when pt is awake/alert. Also recommended NSG to present ice chip trials during their shifts when pt is awake/alert - must have supervision and follow aspiration precautions w/ ice chip trials. NSG updated and agreed. ST services will continue to follow for ongoing assessment of swallow function and safety w/ oral intake.  SLP Visit Diagnosis: Dysphagia, oropharyngeal phase (R13.12)    Aspiration Risk  Moderate aspiration risk(drowsy/lethargy)    Diet Recommendation  NPO w/ frequent oral care for hygiene and stimulation for swallowing; aspiration precautions  Medication Administration: Via alternative means  Other  Recommendations Recommended Consults: (dietician f/u) Oral Care Recommendations: Oral care QID;Staff/trained caregiver to provide oral care Other Recommendations: (TBD)   Follow up Recommendations (TBD)      Frequency and Duration min 3x week  2 weeks       Prognosis Prognosis for Safe  Diet Advancement: Fair Barriers to Reach Goals: Cognitive deficits(state decline)      Swallow Study   General Date of Onset: 08/18/2017 HPI: Pt is a 77 y.o. female with a known history of obesity, asthma, DM, HTN, and recently diagnosed metastatic non-small cell cancer of the lung brought in because of abnormal MRI of the brain.  Patient had MRI of the brain to look for metastasis but it showed acute right frontal stroke.  Patient denies any complaints except that she had a fall last night and she felt dizzy.  Patient has no weakness, no slurred speech.  Follows up with Dr. 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  Supposed to get palliative chemotherapy.  Pertinent to husband patient is not eating or drinking anything recently.  Started on Megace by Dr. Tasia Catchings.  Patient also has shortness of breath, right now she is on 3 L of oxygen and sats are around 99%. Pt was initially talkative and alert at admission. Overnight, she developed new onset Afib/flutter with RVR with heart rates into the 140s bpm. She was started on a Cardizem gtt for rate control. She has since converted to sinus rhythm. Echo is pending. Husband notes the patient is much more somnolent this morning and just appears "out of it."  Type of Study: Bedside Swallow Evaluation Previous Swallow Assessment: none reported Diet Prior to this Study: Regular;Thin liquids Temperature Spikes Noted: No(wbc 26.9; Troponin elevated 0.10) Respiratory Status: Nasal cannula(2-3 liters) History of Recent Intubation: No Behavior/Cognition: Lethargic/Drowsy;Requires cueing;Doesn't follow directions(not consistent) Oral Cavity Assessment: Excessive secretions(min) Oral Care Completed by SLP: Yes Oral Cavity - Dentition: Adequate natural dentition;Missing dentition(lower molars) Vision: (n/a) Self-Feeding Abilities: Total assist Patient Positioning: Upright in bed Baseline Vocal  Quality: Low vocal intensity(muttered/mumbled speech briefly) Volitional Cough: Cognitively unable to elicit Volitional Swallow: Unable to elicit    Oral/Motor/Sensory Function Overall Oral Motor/Sensory Function: (unable to fully assess d/t Cognitive status/lethargy)   Ice Chips Ice chips: Impaired Presentation: Spoon(fed; 6 trials) Oral Phase Impairments: Poor awareness of bolus;Reduced lingual movement/coordination(drowsy) Oral Phase Functional Implications: Prolonged oral transit(drowsy) Pharyngeal Phase Impairments: (none)   Thin Liquid Thin Liquid: Not tested    Nectar Thick Nectar Thick Liquid: Not tested   Honey Thick Honey Thick Liquid: Not tested   Puree Puree: Not tested   Solid   GO   Solid: Not tested        Orinda Kenner, MS, CCC-SLP [facility] [place] 08/14/2017,1:44 PM

## 2017-08-14 NOTE — Progress Notes (Signed)
OT Cancellation Note  Patient Details Name: Carrie Marquez MRN: [id number] DOB: 1940/10/26   Cancelled Treatment:    Reason Eval/Treat Not Completed: Medical issues which prohibited therapy. Order received, chart reviewed. Of note, Cardizem drip initiated this am due to uncontrolled heart rate. Cardiology consult pending. Additional brain MRI w/o contrast ordered secondary to worsening NIH score last night/ this morning. Neurology consult pending. Will OT evaluation this am and re-attempt OT evaluation in afternoon pending plan of care and medically appropriateness.   Jeni Salles, MPH, MS, OTR/L ascom [phone] 08/14/17, 9:59 AM

## 2017-08-14 NOTE — Consult Note (Signed)
Referring Physician: Anselm Jungling    Chief Complaint: Weakness  HPI: Carrie Marquez is an 77 y.o. female who presented to the ED on yesterday as a referral for abnormal MR imaging.  The patient has a recent diagnosis of stage IV metastatic lung cancer.  She has had no treatment. Yesterday underwent an outpatient MRI to evaluate for possible brain metastases.  Her MRI noted no metastases but it was concerning for an acute/subacute infarct.  The patient was felt generally more weak than usual.  No focal symptoms noted.  About 48 hours ago she fell striking the left side of her face.   Initial NIHSS of 4. Was noted to go into afib with RVR overnight.  Now in sinus.  Per family at about 0430 became more lethargic and moving less.    Date last known well: Unable to determine Time last known well: Unable to determine tPA Given: No: Unable to determine LKW  Past Medical History:  Diagnosis Date  . Asthma   . Cancer ([place])   . Diabetes mellitus without complication ([place])   . Hypertension   . Iron deficiency anemia 08/12/2017  . Lung nodule 08/09/2017   Per PETscan order    Past Surgical History:  Procedure Laterality Date  . FLEXIBLE BRONCHOSCOPY N/A 07/27/2017   Procedure: FLEXIBLE BRONCHOSCOPY;  Surgeon: Wilhelmina Mcardle, MD;  Location: [facility] ORS;  Service: Pulmonary;  Laterality: N/A;  . OOPHORECTOMY Right     Family History  Problem Relation Age of Onset  . Breast cancer Mother        64's  . Breast cancer Maternal Aunt   . Ovarian cancer Maternal Aunt   . Colon cancer Sister   . Rectal cancer Brother   . Prostate cancer Brother    Social History:  reports that  has never smoked. she has never used smokeless tobacco. She reports that she does not drink alcohol or use drugs.  Allergies:  Allergies  Allergen Reactions  . Sertaconazole Other (See Comments)  . Statins Other (See Comments)    myalgias    Medications:  I have reviewed the patient's current medications. Prior to  Admission:  Medications Prior to Admission  Medication Sig Dispense Refill Last Dose  . acetaminophen (TYLENOL) 500 MG tablet Take 1,000 mg by mouth every 6 (six) hours as needed for moderate pain or headache.   PRN at PRN  . albuterol (VENTOLIN HFA) 108 (90 BASE) MCG/ACT inhaler Inhale 1 puff into the lungs every 6 (six) hours as needed for wheezing or shortness of breath.    PRN at PRN  . aspirin EC 81 MG tablet Take 81 mg by mouth at bedtime.    08/12/2017 at pm  . carvedilol (COREG) 6.25 MG tablet Take 6.25 mg by mouth 2 (two) times daily with a meal.    Past Week at Unknown time  . cholecalciferol (VITAMIN D) 1000 units tablet Take 1,000 Units by mouth daily.   Past Week at Unknown time  . citalopram (CELEXA) 20 MG tablet Take 20 mg by mouth at bedtime.    08/12/2017 at pm  . Cyanocobalamin (RA VITAMIN B-12 TR) 1000 MCG TBCR Take 1,000 mcg by mouth daily.    Past Week at Unknown time  . fluticasone (FLONASE) 50 MCG/ACT nasal spray Place 1 spray into both nostrils at bedtime.    08/12/2017 at pm  . gabapentin (NEURONTIN) 300 MG capsule Take 300 mg by mouth at bedtime.   08/12/2017 at pm  . HYDROcodone-acetaminophen (  NORCO/VICODIN) 5-325 MG tablet Take 1 tablet by mouth every 6 (six) hours as needed for moderate pain. 30 tablet 0 PRN at PRN  . LORazepam (ATIVAN) 0.5 MG tablet Take 0.5 mg by mouth 3 (three) times daily as needed for anxiety.    PRN at PRN  . losartan-hydrochlorothiazide (HYZAAR) 100-25 MG per tablet Take 1 tablet by mouth daily.    08/10/2017 at am  . megestrol (MEGACE) 40 MG tablet Take 2 tablets (80 mg total) by mouth 2 (two) times daily. 60 tablet 0 08/19/2017 at am  . meloxicam (MOBIC) 15 MG tablet Take 15 mg by mouth daily as needed for pain.    PRN at PRN  . ondansetron (ZOFRAN) 8 MG tablet Take 1 tablet (8 mg total) by mouth 2 (two) times daily as needed for refractory nausea / vomiting. Start on day 3 after carboplatin chemo. 30 tablet 1 PRN at PRN  . prochlorperazine  (COMPAZINE) 10 MG tablet Take 1 tablet (10 mg total) by mouth every 6 (six) hours as needed (Nausea or vomiting). 30 tablet 1 PRN at PRN  . simvastatin (ZOCOR) 20 MG tablet Take 10 mg by mouth at bedtime.   08/12/2017 at pm  . lidocaine-prilocaine (EMLA) cream Apply to affected area once 30 g 3   . potassium chloride SA (K-DUR,KLOR-CON) 20 MEQ tablet Take 1 tablet (20 mEq total) by mouth daily. (Patient not taking: Reported on 08/10/2017) 3 tablet 0 Not Taking at Unknown time   Scheduled: . aspirin EC  81 mg Oral Daily  . carvedilol  6.25 mg Oral BID WC  . cholecalciferol  1,000 Units Oral Daily  . citalopram  20 mg Oral Daily  . enoxaparin (LOVENOX) injection  40 mg Subcutaneous Q12H  . fluticasone  1 spray Each Nare QHS  . losartan  100 mg Oral Daily   And  . hydrochlorothiazide  25 mg Oral Daily  . megestrol  80 mg Oral BID  . simvastatin  10 mg Oral QHS  . vitamin B-12  1,000 mcg Oral Daily    ROS: History obtained from the patient and family  General ROS:  fatigue Psychological ROS: negative for - behavioral disorder, hallucinations, memory difficulties, mood swings or suicidal ideation Ophthalmic ROS: negative for - blurry vision, double vision, eye pain or loss of vision ENT ROS: negative for - epistaxis, nasal discharge, oral lesions, sore throat, tinnitus or vertigo Allergy and Immunology ROS: negative for - hives or itchy/watery eyes Hematological and Lymphatic ROS: negative for - bleeding problems, bruising or swollen lymph nodes Endocrine ROS: negative for - galactorrhea, hair pattern changes, polydipsia/polyuria or temperature intolerance Respiratory ROS: shortness of breath Cardiovascular ROS: negative for - chest pain, dyspnea on exertion, edema or irregular heartbeat Gastrointestinal ROS: negative for - abdominal pain, diarrhea, hematemesis, nausea/vomiting or stool incontinence Genito-Urinary ROS: negative for - dysuria, hematuria, incontinence or urinary  frequency/urgency Musculoskeletal ROS: back pain Neurological ROS: as noted in HPI Dermatological ROS: negative for rash and skin lesion changes  Physical Examination: Blood pressure (!) 107/43, pulse 74, temperature (!) 97.4 F (36.3 C), temperature source Oral, resp. rate 20, height _0  (1.753 m), weight 131.1 kg (289 lb 1.6 oz), SpO2 96 %.  HEENT-  Normocephalic, left eye hematoma, without obvious abnormality.  Normal external eye and conjunctiva.  Normal TM's bilaterally.  Normal auditory canals and external ears. Normal external nose, mucus membranes and septum.  Normal pharynx. Cardiovascular- S1, S2 normal, pulses palpable throughout   Lungs- chest clear, no  wheezing, rales, normal symmetric air entry Abdomen- soft, non-tender; bowel sounds normal; no masses,  no organomegaly Extremities- mild LE edema Lymph-no adenopathy palpable Musculoskeletal-no joint tenderness, deformity or swelling Skin-warm and dry, no hyperpigmentation, vitiligo, or suspicious lesions  Neurological Examination  Mental Status: Lethargic.  Minimal speech.  Recognizes some family members but not daughter.  Speech minimal.  Able to follow simple commands. Cranial Nerves: II: Discs flat bilaterally; Visual fields grossly normal, pupils equal, round, reactive to light and accommodation III,IV, VI: ptosis not present, extra-ocular motions intact bilaterally V,VII: mild left facial droop, facial light touch sensation normal bilaterally VIII: hearing normal bilaterally IX,X: gag reflex present XI: bilateral shoulder shrug XII: midline tongue extension Motor: Able to hold both arms against gravity.  Some myoclonic-like activity noted.  Does not lift the lower extremities off the bed.   Sensory: Responds to noxious stimuli throughout Deep Tendon Reflexes: 1+ in the upper extremities and absent in the lower extremities Plantars: Right: downgoing   Left: upgoing Cerebellar: Unable to perform heel to shin due  to weakness.  Patient able to touch nose with no dysmetria noted Gait: not tested due to safety concerns  Laboratory Studies:  Basic Metabolic Panel: Recent Labs  Lab 08/10/17 1603 08/19/2017 1133 08/12/2017 1245  NA 133* 130* 130*  K 3.1* 3.4* 3.3*  CL 89* 86* 86*  CO2 _0 GLUCOSE 120* 168* 150*  BUN _1 CREATININE 1.69* 0.98 0.99  CALCIUM 8.9 9.2 9.2  MG 2.0  --   --     Liver Function Tests: Recent Labs  Lab 08/10/17 1603 07/30/2017 1133 08/11/2017 1245  AST _2 ALT 13* 14 15  ALKPHOS 68 71 73  BILITOT 0.6 0.9 1.0  PROT 7.1 7.3 7.5  ALBUMIN 3.1* 2.8* 2.9*   No results for input(s): LIPASE, AMYLASE in the last 168 hours. No results for input(s): AMMONIA in the last 168 hours.  CBC: Recent Labs  Lab 08/10/17 1603 08/11/2017 1133 07/27/2017 1245  WBC 20.1* 24.5* 26.9*  NEUTROABS 16.4* 21.3* 23.5*  HGB 10.6* 10.9* 11.4*  HCT 33.2* 33.6* 35.2  MCV 77.7* 78.0* 77.5*  PLT 345 332 340    Cardiac Enzymes: Recent Labs  Lab 08/24/2017 1245 08/26/2017 1548 07/30/2017 2109 08/14/17 0322  TROPONINI 0.13* 0.10* 0.08* 0.10*    BNP: Invalid input(s): POCBNP  CBG: Recent Labs  Lab 08/09/17 1213 08/14/17 0618  GLUCAP 149* 147*    Microbiology: No results found for this or any previous visit.  Coagulation Studies: Recent Labs    08/23/2017 1245  LABPROT 15.1  INR 1.20    Urinalysis: No results for input(s): COLORURINE, LABSPEC, PHURINE, GLUCOSEU, HGBUR, BILIRUBINUR, KETONESUR, PROTEINUR, UROBILINOGEN, NITRITE, LEUKOCYTESUR in the last 168 hours.  Invalid input(s): APPERANCEUR  Lipid Panel:    Component Value Date/Time   CHOL 131 08/14/2017 0322   TRIG 109 08/14/2017 0322   HDL 23 (L) 08/14/2017 0322   CHOLHDL 5.7 08/14/2017 0322   VLDL 22 08/14/2017 0322   LDLCALC 86 08/14/2017 0322    HgbA1C:  Lab Results  Component Value Date   HGBA1C 6.3 (H) 08/14/2017    Urine Drug Screen:  No results found for: LABOPIA, COCAINSCRNUR, LABBENZ,  AMPHETMU, THCU, LABBARB  Alcohol Level: No results for input(s): ETH in the last 168 hours.  Other results: EKG: sinus tachycardia at 109 bpm.  Imaging: Dg Chest 2 View  Result Date: 08/01/2017 CLINICAL DATA:  Shortness of breath and hypoxia. EXAM:  CHEST - 2 VIEW COMPARISON:  CT and PET scan FINDINGS: Heart size is normal. Large right hilar and suprahilar mass with right upper lobe volume loss. Left lung is clear. Small amount of pleural fluid on the right. No acute bone finding. IMPRESSION: Similar appearance to the previous studies. Right hilar and suprahilar mass with right upper lobe volume loss and small amount of pleural fluid on the right. Electronically Signed   By: Nelson Chimes M.D.   On: 08/06/2017 13:45   Ct Angio Chest Pe W/cm &/or Wo Cm  Result Date: 08/12/2017 CLINICAL DATA:  Shortness of breath and weakness.  Lung cancer. EXAM: CT ANGIOGRAPHY CHEST WITH CONTRAST TECHNIQUE: Multidetector CT imaging of the chest was performed using the standard protocol during bolus administration of intravenous contrast. Multiplanar CT image reconstructions and MIPs were obtained to evaluate the vascular anatomy. CONTRAST:  133m ISOVUE-370 IOPAMIDOL (ISOVUE-370) INJECTION 76% COMPARISON:  PET CT 08/09/2017 FINDINGS: Cardiovascular: Contrast injection is sufficient to demonstrate satisfactory opacification of the pulmonary arteries to the segmental level. There is no pulmonary embolus. The main pulmonary artery is mildly enlarged, measuring 3.2 cm. There is a normal 3-vessel arch branching pattern without evidence of acute aortic syndrome. There is mildaortic atherosclerosis. Heart size is normal, without pericardial effusion. Mediastinum/Nodes: Multiple right paratracheal nodes measuring up to 1.9 cm. No axillary lymphadenopathy. Lungs/Pleura: Confluent right parahilar mass encases the right mainstem bronchus and the proximal aspect of the right upper lobe bronchus and bronchus intermedius. The size of  the mass measures 7.5 x 5.6 cm, previously 7.0 x 5.0 cm. The right pulmonary artery is also encased. There is severe narrowing of the right upper lobar artery. There is a region of confluent opacity in the posterior right upper lobe measuring up to 7 cm. Small right pleural effusion. Pulmonary nodules within the right lung measure up to 9 x 6 mm. Nodules in the left lung measure up to 10 x 11 mm. Upper Abdomen: Contrast bolus timing is not optimized for evaluation of the abdominal organs. Right adrenal mass measures 2.8 x 2.2 cm, but is incompletely visualized. Incompletely visualized left adrenal mass measures up to 5.6 x 4.2 cm. The remainder of the visible upper abdominal organs are normal. Musculoskeletal: Suspected osseous metastases are better characterized on the recent PET CT. There are no large lytic or blastic lesions. Review of the MIP images confirms the above findings. IMPRESSION: 1. No pulmonary embolus or acute aortic syndrome. 2. Large right parahilar mass encasing the right mainstem bronchus and right pulmonary artery with severe narrowing of the right upper lobar artery. Slight increase in size of the mass may be due to slice selection. 3. Multiple nodular pulmonary metastases within both lungs. 4. Area of consolidation in the posterior right upper lobe, concerning for pneumonia. 5. Bilateral adrenal metastases, left-greater-than-right. 6. Osseous metastases suggested on the PET CT are not visible on this scan. 7.  Aortic Atherosclerosis (ICD10-I70.0). Electronically Signed   By: KUlyses JarredM.D.   On: 07/29/2017 14:48   Mr Brain Wo Contrast  Result Date: 08/14/2017 CLINICAL DATA:  Lung cancer with known metastatic disease to the adrenal glands. Abnormal MRI brain 08/13/2016. EXAM: MRI HEAD WITHOUT CONTRAST TECHNIQUE: Multiplanar, multiecho pulse sequences of the brain and surrounding structures were obtained without intravenous contrast. COMPARISON:  MRI brain 08/13/2016. FINDINGS: Brain: A  focal area of restricted diffusion is again noted within the posteromedial right frontal lobe along the cingulate gyrus. There is no significant interval change. No additional lesions are  present. There is increased patient motion on today's scan compared to the scan from yesterday. Remote lacunar infarcts are present in the basal ganglia. Periventricular and subcortical T2 changes bilaterally are stable. White matter changes extend into the brainstem cerebellum is unremarkable. Vascular: Normal flow voids. Skull and upper cervical spine: The skull base is within normal limits. The craniocervical junction is normal. Decreased marrow signal in the upper cervical spine is concerning for metastatic disease. Sinuses/Orbits: A fluid level is present in the left maxillary sinus. A remote left orbital blowout fracture is present. Paranasal sinuses are otherwise clear. Globes and orbits are otherwise within normal limits. Bilateral lens replacements are noted. IMPRESSION: 1. Stable focal area of restricted diffusion involving the posteromedial right frontal lobe measuring less than 10 mm. Findings are compatible with acute/subacute infarct involving the SMA. Neurologic sequela may be difficult to detect. 2. Stable atrophy and advanced white matter disease. This likely reflects the sequela of chronic microvascular ischemia. 3. No other new or acute lesions to suggest metastatic disease to the brain. Electronically Signed   By: San Morelle M.D.   On: 08/14/2017 13:18   Mr Brain Wo Contrast  Result Date: 08/05/2017 CLINICAL DATA:  Recently diagnosed lung cancer. IV contrast was not administered due to diminished renal function (GFR of 29). EXAM: MRI HEAD WITHOUT CONTRAST TECHNIQUE: Multiplanar, multiecho pulse sequences of the brain and surrounding structures were obtained without intravenous contrast. COMPARISON:  None. FINDINGS: Brain: There is a 5 mm focus of restricted diffusion in the subcortical white matter  of the posteromedial right frontal lobe (series 5, image 35) with mild T2/FLAIR hyperintensity suggestive of an acute/early acute small vessel infarct. No acute infarct is identified elsewhere. No intracranial mass lesion or vasogenic edema is identified to suggest metastatic disease, however lack of IV contrast reduces sensitivity for detection of small lesions. Patchy T2 hyperintensities in the subcortical and deep cerebral white matter and pons are nonspecific but compatible with mild-to-moderate chronic small vessel ischemic disease. 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 The ventricles and sulci are normal for age. Vascular: Major intracranial vascular flow voids are preserved. Skull and upper cervical spine: No suspicious marrow lesion. Sinuses/Orbits: Bilateral cataract extraction. Complex left maxillary sinus fluid. Clear mastoid air cells. Other: None. IMPRESSION: 1. No definite evidence of intracranial metastases on this unenhanced examination. Short-term follow-up brain MRI with contrast is recommended when the patient's renal function improves. 2. 5 mm focus of restricted diffusion in the right frontal lobe most consistent with acute/early subacute infarct. 3. Mild-to-moderate chronic small vessel ischemic disease. Electronically Signed   By: Logan Bores M.D.   On: 07/29/2017 10:52   US Carotid Bilateral (at [facility] And [facility] Only)  Result Date: 08/14/2017 CLINICAL DATA:  77 year old female with acute right frontal lobe mini-stroke. EXAM: BILATERAL CAROTID DUPLEX ULTRASOUND TECHNIQUE: Pearline Cables scale imaging, color Doppler and duplex ultrasound were performed of bilateral carotid and vertebral arteries in the neck. COMPARISON:  Brain MRI 08/22/2017 FINDINGS: Criteria: Quantification of carotid stenosis is based on velocity parameters that correlate the residual internal carotid diameter with NASCET-based stenosis levels, using the diameter of the distal internal  carotid lumen as the denominator for stenosis measurement. The following velocity measurements were obtained: RIGHT ICA:  109/53 cm/sec CCA:  440/34 cm/sec SYSTOLIC ICA/CCA RATIO:  0.8 DIASTOLIC ICA/CCA RATIO:  1.2 ECA:  135 cm/sec LEFT ICA:  141/66 cm/sec CCA:  742/59 cm/sec SYSTOLIC ICA/CCA RATIO:  1.1 DIASTOLIC ICA/CCA RATIO:  1.7 ECA:  96 cm/sec RIGHT CAROTID ARTERY: No significant atherosclerotic plaque or evidence of stenosis. RIGHT VERTEBRAL ARTERY:  Patent with normal antegrade flow. LEFT CAROTID ARTERY: Focal heterogeneous atherosclerotic plaque in the proximal internal carotid artery. By peak systolic velocity criteria, the estimated stenosis falls in the 50-69% diameter range. LEFT VERTEBRAL ARTERY:  Patent with normal antegrade flow. IMPRESSION: 1. 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 2. No significant atherosclerotic plaque or evidence of stenosis in the right internal carotid artery. 3. The vertebral arteries are patent with normal antegrade flow. 4. Tachycardic cardiac rhythm noted on Doppler tracings. Signed, Criselda Peaches, MD [facility] Radiology Electronically Signed   By: Jacqulynn Cadet M.D.   On: 08/14/2017 07:47    Assessment: 77 y.o. female presenting with metastatic lung cancer and weakness.  MRI of the brain performed in work up reviewed and shows no metastatic lesions but does show a small right frontal lobe area of restricted diffusion.  Patient became more lethargic overnight with development of atrial fibrillation.  Repeat MRI shows no new lesions.  Carotid dopplers show no evidence of hemodynamically significant stenosis on the right and 50-69% stenosis on the left.  Echocardiogram pending but with development of atrial fibrillation would start anticoagulation since etiology of acute infarct likely embolic.  A1c 6.3, LDL 86. Infarct size is  small and not likely cause of generalized weakness and lethargy.  WBC count elevated and likely due to infection.   Also concerned about patient leg weakness.  PET scan shows multiple bony lesions.    Stroke Risk Factors - diabetes mellitus and hypertension  Plan: 1. PT consult, OT consult, Speech consult 2. Echocardiogram pending 3. Prophylactic therapy-Anticoagulation 4. NPO until RN stroke swallow screen 5. Telemetry monitoring 6. Frequent neuro checks 7. MRI of the lumbar spine   Alexis Goodell, MD Neurology [phone] 08/14/2017, 1:41 PM

## 2017-08-14 NOTE — Consult Note (Signed)
Consultation Note Date: 08/14/2017   Patient Name: Carrie Marquez  DOB: May 11, 1941  MRN: [id number]  Age / Sex: 77 y.o., female  PCP: Baxter Hire, MD Referring Physician: Vaughan Basta, *  Reason for Consultation: Establishing goals of care  HPI/Patient Profile: Carrie Marquez  is a 77 y.o. 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  Patient had MRI of the brain to look for metastasis but it showed acute right frontal stroke.  Clinical Assessment and Goals of Care: Patient resting in bed on BIPAP. Family at bedside, states she just began having back and shoulder pain 5 weeks ago. She was diagnosed with cancer and had the head MRI showing the CVA. The family feels like this is happening so quickly. They do want her comfort to be a priority. Plans to continue BIPAP. They would not want chest compressions, shocks, or intubation. They would not want intubation for respiratory distress/failure. Would like to continue medications, IVF, and BIPAP.     SUMMARY OF RECOMMENDATIONS    On BIPAP, will follow. Poor prognosis.    Code Status/Advance Care Planning:  DNR    Symptom Management:   Per primary team  Palliative Prophylaxis:   Eye Care and Oral Care   Prognosis:   Poor. BIPAP.   Discharge Planning: To Be Determined      Primary Diagnoses: Present on Admission: . Acute ischemic stroke ([place])   I have reviewed the medical record, interviewed the patient and family, and examined the patient. The following aspects are pertinent.  Past Medical History:  Diagnosis Date  . Asthma   . Cancer ([place])   . Diabetes mellitus without complication ([place])   . Hypertension   . Iron deficiency anemia 08/12/2017  . Lung nodule 08/09/2017   Per PETscan order   Social History   Socioeconomic  History  . Marital status: Married    Spouse name: None  . Number of children: None  . Years of education: None  . Highest education level: None  Social Needs  . Financial resource strain: None  . Food insecurity - worry: None  . Food insecurity - inability: None  . Transportation needs - medical: None  . Transportation needs - non-medical: None  Occupational History  . None  Tobacco Use  . Smoking status: Never Smoker  . Smokeless tobacco: Never Used  Substance and Sexual Activity  . Alcohol use: No    Frequency: Never  . Drug use: No  . Sexual activity: None  Other Topics Concern  . None  Social History Narrative  . None   Family History  Problem Relation Age of Onset  . Breast cancer Mother        43's  . Breast cancer Maternal Aunt   . Ovarian cancer Maternal Aunt   . Colon cancer Sister   . Rectal cancer Brother   . Prostate cancer Brother    Scheduled Meds: . apixaban  5 mg Oral BID  . carvedilol  6.25 mg Oral BID WC  . cholecalciferol  1,000 Units Oral Daily  . citalopram  20 mg Oral Daily  . fluticasone  1 spray Each Nare QHS  . losartan  100 mg Oral Daily   And  . hydrochlorothiazide  25 mg Oral Daily  . megestrol  80 mg Oral BID  . simvastatin  10 mg Oral QHS  . vitamin B-12  1,000 mcg Oral Daily   Continuous Infusions: . sodium chloride 50 mL/hr at 08/14/17 0026  . levofloxacin (LEVAQUIN) IV Stopped (08/12/2017 1649)  .  sodium bicarbonate  infusion 1000 mL    . sodium chloride     PRN Meds:.acetaminophen **OR** acetaminophen (TYLENOL) oral liquid 160 mg/5 mL **OR** acetaminophen, albuterol, LORazepam Medications Prior to Admission:  Prior to Admission medications   Medication Sig Start Date End Date Taking? Authorizing Provider  acetaminophen (TYLENOL) 500 MG tablet Take 1,000 mg by mouth every 6 (six) hours as needed for moderate pain or headache.   Yes [provider]  albuterol (VENTOLIN HFA) 108 (90 BASE) MCG/ACT inhaler Inhale 1  puff into the lungs every 6 (six) hours as needed for wheezing or shortness of breath.    Yes [provider]  aspirin EC 81 MG tablet Take 81 mg by mouth at bedtime.    Yes [provider]  carvedilol (COREG) 6.25 MG tablet Take 6.25 mg by mouth 2 (two) times daily with a meal.  01/05/14  Yes [provider]  cholecalciferol (VITAMIN D) 1000 units tablet Take 1,000 Units by mouth daily.   Yes [provider]  citalopram (CELEXA) 20 MG tablet Take 20 mg by mouth at bedtime.    Yes [provider]  Cyanocobalamin (RA VITAMIN B-12 TR) 1000 MCG TBCR Take 1,000 mcg by mouth daily.    Yes [provider]  fluticasone (FLONASE) 50 MCG/ACT nasal spray Place 1 spray into both nostrils at bedtime.    Yes [provider]  gabapentin (NEURONTIN) 300 MG capsule Take 300 mg by mouth at bedtime. 12/01/16  Yes [provider]  HYDROcodone-acetaminophen (NORCO/VICODIN) 5-325 MG tablet Take 1 tablet by mouth every 6 (six) hours as needed for moderate pain. 08/02/17  Yes Earlie Server, MD  LORazepam (ATIVAN) 0.5 MG tablet Take 0.5 mg by mouth 3 (three) times daily as needed for anxiety.    Yes [provider]  losartan-hydrochlorothiazide (HYZAAR) 100-25 MG per tablet Take 1 tablet by mouth daily.  11/11/13  Yes [provider]  megestrol (MEGACE) 40 MG tablet Take 2 tablets (80 mg total) by mouth 2 (two) times daily. 08/08/17  Yes Earlie Server, MD  meloxicam (MOBIC) 15 MG tablet Take 15 mg by mouth daily as needed for pain.    Yes [provider]  ondansetron (ZOFRAN) 8 MG tablet Take 1 tablet (8 mg total) by mouth 2 (two) times daily as needed for refractory nausea / vomiting. Start on day 3 after carboplatin chemo. 08/12/17  Yes Earlie Server, MD  prochlorperazine (COMPAZINE) 10 MG tablet Take 1 tablet (10 mg total) by mouth every 6 (six) hours as needed (Nausea or vomiting). 08/12/17  Yes Earlie Server, MD  simvastatin (ZOCOR) 20 MG tablet Take  10 mg by mouth at bedtime.   Yes [provider]  lidocaine-prilocaine (EMLA) cream Apply to affected area once 08/12/17   Earlie Server, MD  potassium chloride SA (K-DUR,KLOR-CON) 20 MEQ tablet Take 1 tablet (20 mEq total)  by mouth daily. Patient not taking: Reported on 08/10/2017 08/02/17   Earlie Server, MD   Allergies  Allergen Reactions  . Sertaconazole Other (See Comments)  . Statins Other (See Comments)    myalgias   Review of Systems  Physical Exam  Vital Signs: BP (!) 93/33   Pulse 74   Temp (!) 95.8 F (35.4 C) (Tympanic)   Resp 20   Ht 5\' 9"  (1.753 m)   Wt 131.1 kg (289 lb 1.6 oz)   SpO2 90%   BMI 42.69 kg/m  Pain Assessment: No/denies pain   Pain Score: 0-No pain   SpO2: SpO2: 90 % O2 Device:SpO2: 90 % O2 Flow Rate: .O2 Flow Rate (L/min): 3 L/min  IO: Intake/output summary:   Intake/Output Summary (Last 24 hours) at 08/14/2017 1529 Last data filed at 08/14/2017 0026 Gross per 24 hour  Intake 540.83 ml  Output -  Net 540.83 ml    LBM: Last BM Date: 07/31/2017 Baseline Weight: Weight: 131.5 kg (290 lb) Most recent weight: Weight: 131.1 kg (289 lb 1.6 oz)     Palliative Assessment/Data: on BIPAP     Time In:3:30 Time Out: 4:40 Time Total: 70 min Greater than 50%  of this time was spent counseling and coordinating care related to the above assessment and plan.  Signed by: Asencion Gowda, NP   Please contact Palliative Medicine Team phone at [phone] for questions and concerns.  For individual provider: See Shea Evans

## 2017-08-14 NOTE — Progress Notes (Signed)
Rapid Response Event Note  Overview: Rapid response called as patient condition has worsened, MD paged and notified, coming to assess patient.       Initial Focused Assessment: Patient nonresponsive, BP 93/33, diaphoretic, BG 166.    Interventions: MD paged, ABG ordered, 500 Normal saline bolus, diltiazem gtt stopped.   Plan of Care (if not transferred):  Event Summary: Patient transferred to ICU, family aware of situation.                  Arden Tinoco D Caydee Talkington

## 2017-08-14 NOTE — Progress Notes (Signed)
[facility] [place] NAME: Carrie Marquez    MR#:  [id number]  DATE OF BIRTH:  08-04-1940  SUBJECTIVE:  CHIEF COMPLAINT:   Chief Complaint  Patient presents with  . Cerebrovascular Accident    brought with acute / sub acute stroke, had A fib last night- moved to Tele. Now more lethargic this morning,  Later became increasing sleepy, and now rapid response due to unresponsiveness. Pt is cold, clamy, BP slightly low.  REVIEW OF SYSTEMS:  Pt is unresponsive.  ROS  DRUG ALLERGIES:   Allergies  Allergen Reactions  . Sertaconazole Other (See Comments)  . Statins Other (See Comments)    myalgias    VITALS:  Blood pressure (!) 91/44, pulse 67, temperature (!) 95.8 F (35.4 C), temperature source Tympanic, resp. rate (!) 24, height 5\' 9"  (1.753 m), weight 131.1 kg (289 lb 1.6 oz), SpO2 96 %.  PHYSICAL EXAMINATION:  GENERAL:  77 y.o.-year-old patient lying in the bed with no acute distress. Cold and clamy. EYES: Pupils equal, round, reactive to light . No scleral icterus. Extraocular muscles intact.  HEENT: Head atraumatic, normocephalic. Oropharynx and nasopharynx clear.  NECK:  Supple, no jugular venous distention. No thyroid enlargement, no tenderness.  LUNGS: Normal breath sounds bilaterally, no wheezing,  or crepitation. No use of accessory muscles of respiration.  CARDIOVASCULAR: S1, S2 normal. No murmurs, rubs, or gallops.  ABDOMEN: Soft, nontender, nondistended. Bowel sounds present. No organomegaly or mass.  EXTREMITIES: No pedal edema, cyanosis, or clubbing.  NEUROLOGIC: unresponsive to painful stimuli, spontaneous breathing. PSYCHIATRIC: The patient is drowsy , unresponsive. SKIN: No obvious rash, lesion, or ulcer.   Physical Exam LABORATORY PANEL:   CBC Recent Labs  Lab 08/14/17 1458  WBC 32.0*  HGB 10.6*  HCT 34.6*  PLT 435    ------------------------------------------------------------------------------------------------------------------  Chemistries  Recent Labs  Lab 08/10/17 1603  07/28/2017 1245 08/14/17 1458  NA 133*   < > 130* 135  K 3.1*   < > 3.3* 6.1*  CL 89*   < > 86* 94*  CO2 31   < > 31 33*  GLUCOSE 120*   < > 150* 159*  BUN 19   < > 16 24*  CREATININE 1.69*   < > 0.99 1.87*  CALCIUM 8.9   < > 9.2 8.9  MG 2.0  --   --   --   AST 19   < > 19  --   ALT 13*   < > 15  --   ALKPHOS 68   < > 73  --   BILITOT 0.6   < > 1.0  --    < > = values in this interval not displayed.   ------------------------------------------------------------------------------------------------------------------  Cardiac Enzymes Recent Labs  Lab 08/23/2017 2109 08/14/17 0322  TROPONINI 0.08* 0.10*   ------------------------------------------------------------------------------------------------------------------  RADIOLOGY:  Dg Chest 2 View  Result Date: 08/08/2017 CLINICAL DATA:  Shortness of breath and hypoxia. EXAM: CHEST - 2 VIEW COMPARISON:  CT and PET scan FINDINGS: Heart size is normal. Large right hilar and suprahilar mass with right upper lobe volume loss. Left lung is clear. Small amount of pleural fluid on the right. No acute bone finding. IMPRESSION: Similar appearance to the previous studies. Right hilar and suprahilar mass with right upper lobe volume loss and small amount of pleural fluid on the right. Electronically Signed   By: Nelson Chimes M.D.   On: 08/23/2017 13:45   Ct Angio Chest Pe  W/cm &/or Wo Cm  Result Date: 08/26/2017 CLINICAL DATA:  Shortness of breath and weakness.  Lung cancer. EXAM: CT ANGIOGRAPHY CHEST WITH CONTRAST TECHNIQUE: Multidetector CT imaging of the chest was performed using the standard protocol during bolus administration of intravenous contrast. Multiplanar CT image reconstructions and MIPs were obtained to evaluate the vascular anatomy. CONTRAST:  161mL ISOVUE-370  IOPAMIDOL (ISOVUE-370) INJECTION 76% COMPARISON:  PET CT 08/09/2017 FINDINGS: Cardiovascular: Contrast injection is sufficient to demonstrate satisfactory opacification of the pulmonary arteries to the segmental level. There is no pulmonary embolus. The main pulmonary artery is mildly enlarged, measuring 3.2 cm. There is a normal 3-vessel arch branching pattern without evidence of acute aortic syndrome. There is mildaortic atherosclerosis. Heart size is normal, without pericardial effusion. Mediastinum/Nodes: Multiple right paratracheal nodes measuring up to 1.9 cm. No axillary lymphadenopathy. Lungs/Pleura: Confluent right parahilar mass encases the right mainstem bronchus and the proximal aspect of the right upper lobe bronchus and bronchus intermedius. The size of the mass measures 7.5 x 5.6 cm, previously 7.0 x 5.0 cm. The right pulmonary artery is also encased. There is severe narrowing of the right upper lobar artery. There is a region of confluent opacity in the posterior right upper lobe measuring up to 7 cm. Small right pleural effusion. Pulmonary nodules within the right lung measure up to 9 x 6 mm. Nodules in the left lung measure up to 10 x 11 mm. Upper Abdomen: Contrast bolus timing is not optimized for evaluation of the abdominal organs. Right adrenal mass measures 2.8 x 2.2 cm, but is incompletely visualized. Incompletely visualized left adrenal mass measures up to 5.6 x 4.2 cm. The remainder of the visible upper abdominal organs are normal. Musculoskeletal: Suspected osseous metastases are better characterized on the recent PET CT. There are no large lytic or blastic lesions. Review of the MIP images confirms the above findings. IMPRESSION: 1. No pulmonary embolus or acute aortic syndrome. 2. Large right parahilar mass encasing the right mainstem bronchus and right pulmonary artery with severe narrowing of the right upper lobar artery. Slight increase in size of the mass may be due to slice  selection. 3. Multiple nodular pulmonary metastases within both lungs. 4. Area of consolidation in the posterior right upper lobe, concerning for pneumonia. 5. Bilateral adrenal metastases, left-greater-than-right. 6. Osseous metastases suggested on the PET CT are not visible on this scan. 7.  Aortic Atherosclerosis (ICD10-I70.0). Electronically Signed   By: Ulyses Jarred M.D.   On: 08/09/2017 14:48   Mr Brain Wo Contrast  Result Date: 08/14/2017 CLINICAL DATA:  Lung cancer with known metastatic disease to the adrenal glands. Abnormal MRI brain 08/13/2016. EXAM: MRI HEAD WITHOUT CONTRAST TECHNIQUE: Multiplanar, multiecho pulse sequences of the brain and surrounding structures were obtained without intravenous contrast. COMPARISON:  MRI brain 08/13/2016. FINDINGS: Brain: A focal area of restricted diffusion is again noted within the posteromedial right frontal lobe along the cingulate gyrus. There is no significant interval change. No additional lesions are present. There is increased patient motion on today's scan compared to the scan from yesterday. Remote lacunar infarcts are present in the basal ganglia. Periventricular and subcortical T2 changes bilaterally are stable. White matter changes extend into the brainstem cerebellum is unremarkable. Vascular: Normal flow voids. Skull and upper cervical spine: The skull base is within normal limits. The craniocervical junction is normal. Decreased marrow signal in the upper cervical spine is concerning for metastatic disease. Sinuses/Orbits: A fluid level is present in the left maxillary sinus. A remote left  orbital blowout fracture is present. Paranasal sinuses are otherwise clear. Globes and orbits are otherwise within normal limits. Bilateral lens replacements are noted. IMPRESSION: 1. Stable focal area of restricted diffusion involving the posteromedial right frontal lobe measuring less than 10 mm. Findings are compatible with acute/subacute infarct involving  the SMA. Neurologic sequela may be difficult to detect. 2. Stable atrophy and advanced white matter disease. This likely reflects the sequela of chronic microvascular ischemia. 3. No other new or acute lesions to suggest metastatic disease to the brain. Electronically Signed   By: San Morelle M.D.   On: 08/14/2017 13:18   Mr Brain Wo Contrast  Result Date: 08/11/2017 CLINICAL DATA:  Recently diagnosed lung cancer. IV contrast was not administered due to diminished renal function (GFR of 29). EXAM: MRI HEAD WITHOUT CONTRAST TECHNIQUE: Multiplanar, multiecho pulse sequences of the brain and surrounding structures were obtained without intravenous contrast. COMPARISON:  None. FINDINGS: Brain: There is a 5 mm focus of restricted diffusion in the subcortical white matter of the posteromedial right frontal lobe (series 5, image 35) with mild T2/FLAIR hyperintensity suggestive of an acute/early acute small vessel infarct. No acute infarct is identified elsewhere. No intracranial mass lesion or vasogenic edema is identified to suggest metastatic disease, however lack of IV contrast reduces sensitivity for detection of small lesions. Patchy T2 hyperintensities in the subcortical and deep cerebral white matter and pons are nonspecific but compatible with mild-to-moderate chronic small vessel ischemic disease. 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 The ventricles and sulci are normal for age. Vascular: Major intracranial vascular flow voids are preserved. Skull and upper cervical spine: No suspicious marrow lesion. Sinuses/Orbits: Bilateral cataract extraction. Complex left maxillary sinus fluid. Clear mastoid air cells. Other: None. IMPRESSION: 1. No definite evidence of intracranial metastases on this unenhanced examination. Short-term follow-up brain MRI with contrast is recommended when the patient's renal function improves. 2. 5 mm focus of restricted diffusion  in the right frontal lobe most consistent with acute/early subacute infarct. 3. Mild-to-moderate chronic small vessel ischemic disease. Electronically Signed   By: Logan Bores M.D.   On: 08/09/2017 10:52   US Carotid Bilateral (at [facility] Only)  Result Date: 08/14/2017 CLINICAL DATA:  77 year old female with acute right frontal lobe mini-stroke. EXAM: BILATERAL CAROTID DUPLEX ULTRASOUND TECHNIQUE: Pearline Cables scale imaging, color Doppler and duplex ultrasound were performed of bilateral carotid and vertebral arteries in the neck. COMPARISON:  Brain MRI 08/12/2017 FINDINGS: Criteria: Quantification of carotid stenosis is based on velocity parameters that correlate the residual internal carotid diameter with NASCET-based stenosis levels, using the diameter of the distal internal carotid lumen as the denominator for stenosis measurement. The following velocity measurements were obtained: RIGHT ICA:  109/53 cm/sec CCA:  616/07 cm/sec SYSTOLIC ICA/CCA RATIO:  0.8 DIASTOLIC ICA/CCA RATIO:  1.2 ECA:  135 cm/sec LEFT ICA:  141/66 cm/sec CCA:  371/06 cm/sec SYSTOLIC ICA/CCA RATIO:  1.1 DIASTOLIC ICA/CCA RATIO:  1.7 ECA:  96 cm/sec RIGHT CAROTID ARTERY: No significant atherosclerotic plaque or evidence of stenosis. RIGHT VERTEBRAL ARTERY:  Patent with normal antegrade flow. LEFT CAROTID ARTERY: Focal heterogeneous atherosclerotic plaque in the proximal internal carotid artery. By peak systolic velocity criteria, the estimated stenosis falls in the 50-69% diameter range. LEFT VERTEBRAL ARTERY:  Patent with normal antegrade flow. IMPRESSION: 1. 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 2. No significant atherosclerotic plaque or evidence of stenosis in the right  internal carotid artery. 3. The vertebral arteries are patent with normal antegrade flow. 4. Tachycardic cardiac rhythm noted on Doppler tracings. Signed, Criselda Peaches, MD [facility] Electronically Signed   By: Jacqulynn Cadet M.D.   On: 08/14/2017 07:47    ASSESSMENT AND PLAN:   Active Problems:   Primary malignant neoplasm of right lung metastatic to other site [facility])   Acute ischemic stroke (HCC)   Paroxysmal atrial fibrillation (HCC)   Demand ischemia (HCC)   Cerebrovascular accident (CVA) ([place])   Malignant neoplasm of lung ([place])  77 year old female patient with multiple medical problems of recently diagnosed metastatic lung cancer has acute stroke. * Unresponsive   May be due to underlying stroke? Hepercapneia   Checked Blood sugar- stable. Repeat MRI is negative for new findings.   BP running on lower normal- hold cardzem drip, IV fluid bolus.   Stat ABG, CBC, BMP.   Spoke to family about likely requirement of Bipap and close monitoring.   Moving to step down unit.    Husband and daughter had agreed on DNR, DNI, but want to try bipap, if needed.    I spoke to Dr. Mortimer Fries and explained the findings. Further care per him.  *  Acute to subacute right frontal stroke:  already on aspirin, statins, check echocardiogram, ultrasound of carotids, obtain neurology consult, PT evaluation, stroke swallow screen,   there is worsening in mental status- repeat MRI. *  Hypoxia and tachycardia; CT angios chest did not show PE.  But it showed posterior upper lobe pneumonia, large right perihilar mass encasing the bronchus and right pulmonary artery, her hypoxia is also coming from large perihilar mass encasing the right main bronchus, right pulmonary artery.  Empirically started on antibiotics, levaquin consult oncology Dr. Tasia Catchings regarding chemotherapy options. * altered mental status-  negative MRI to r/o new stroke. * A fib with RVR- cardizem drip, tele, cardio consult   Anticoaulant as advised by cardio and neuro. * .anorexia: Started on Megace. *. hypokalemia, hyponatremia,   on gentle hydration. *  Slightly  elevated troponins: Likely secondary to demand ischemia from her right lung cancer, shortness of breath.  remained stable on Cycle the troponins.  Continue aspirin, Coreg.  Patient already is getting echocardiogram.  Prognosis  is poor secondary to metastatic lung cancer. A fib, stroke, worsening mental status. D/w husband, daughter, brother in room.   All the records are reviewed and case discussed with Care Management/Social Workerr. Management plans discussed with the patient, family and they are in agreement.  CODE STATUS: DNR  ADDITIONAL CRITICAL CARE TIME TAKING CARE OF THIS PATIENT: 40 minutes.    POSSIBLE D/C IN 2-3 DAYS, DEPENDING ON CLINICAL CONDITION.   Vaughan Basta M.D on 08/14/2017   Between 7am to 6pm - Pager - [phone]  After 6pm go to [email or web address] - password [email or web address] [facility]  Office  [phone]  CC: Primary care physician; Baxter Hire, MD  Note: This dictation was prepared with Dragon dictation along with smaller phrase technology. Any transcriptional errors that result from this process are unintentional.

## 2017-08-14 NOTE — Consult Note (Signed)
   CHIEF COMPLAINT:   Chief Complaint  Patient presents with  . Cerebrovascular Accident    Subjective  77 yo obese white female with new dx of stage 4 Lung cancer admitted to ICU for severe resp failure and acidosis On biPAP, patient with new CVA, patient comatosed and unresponsive, now with severe hyportension  Family at bedside, updated and notified Prognosis is very poor I have confirmed DNR/DNI STatus     Review of Systems: Unobtainable due to critical illness     Objective   Examination:  General exam:obtunded Respiratory system: Clear to auscultation. Respiratory effort normal. HEENT: Agency/AT, PERRLA, no thrush, no stridor. Cardiovascular system: S1 & S2 heard, RRR. No JVD, murmurs, rubs, gallops or clicks. No pedal edema. Gastrointestinal system: Abdomen is nondistended, soft and nontender. No organomegaly or masses felt. Normal bowel sounds heard. Central nervous system: gcs<8 Extremities: flaccid Skin: No rashes, lesions or ulcers   VITALS:  height is 5\' 9"  (1.753 m) and weight is 289 lb 1.6 oz (131.1 kg). Her tympanic temperature is 95.8 F (35.4 C) (abnormal). Her blood pressure is 93/33 (abnormal) and her pulse is 74. Her respiration is 20 and oxygen saturation is 90%.   I personally reviewed Labs under Results section.  Radiology Reports CXR 3.18.19 RT sided opacification c/w l ung mass   MRI report Findings are compatible with acute/subacute infarct involving the SMA-.RT frontal  Assessment/Plan:  77 yo obese white female with stage 4 lung cancer with acute and severe respiratory  failure with severe metabolic acidosis and encephalopathy with multiorgan failure  1.start biPAP 2.oxygen as needed 3.start Bicarb Infusion 4.aspiration precautions 5.renal failure-follow UOP-prognosis is grave  Palliative care consulted  Code Status: DNR/DNI  Family Communication: updated at bedside  Disposition Plan: recommend Comfort care  measures   Critical Care Time devoted to patient care services described in this note is 45 minutes.   Overall, patient is critically ill, prognosis is guarded.  Patient with Multiorgan failure and at high risk for cardiac arrest and death.  I have explained to family that patient is comatosed and in dying process. They understand grave prognosis Will continue bicarb and biPAP and assess status.   Corrin Parker, M.D.  [facility]  Medical Director Meta Director [facility] Cardio-Pulmonary Department

## 2017-08-14 NOTE — Progress Notes (Signed)
Family Meeting Note  Advance Directive:yes  Today a meeting took place with the spouse and daughter.  Patient is unable to participate due QV:LDKCCQ capacity lethargy.   The following clinical team members were present during this meeting:MD  The following were discussed:Patient's diagnosis: metastatic lung cancer, acute stroke, hypoxia, new A fib , Patient's progosis: < 12 months and Goals for treatment: Full Code  Talked to family about very poor prognosis, encouraged them to make decision about code status.  Additional follow-up to be provided: neuro, Oncology.  Time spent during discussion:20 minutes  Vaughan Basta, MD

## 2017-08-14 NOTE — Progress Notes (Signed)
Consulted with MD about pts sustained HR in the 130s and 140s after receiving IV Cardizem and Cardizem tabs.  MD requested pt be moved to telemetry for a Cardizem drip and a cardiologist consult.

## 2017-08-14 NOTE — Progress Notes (Signed)
Hematology/Oncology Consult note [facility] Telephone:([phone] [phone]  Patient Care Team: Baxter Hire, MD as PCP - General (Internal Medicine) Telford Nab, RN as Registered Nurse   Name of the patient: Carrie Marquez  [id number]  [birth year]   Date of visit: 08/14/17   REASON FOR COSULTATION:  Lung cancer.  History of presenting illness Ms. 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  She was sent to emergency room from my office after her MRI brain which was done for staging.  No obvious CNS metastasis was discovered however she was found to have a new 5 mm acute frontal lobe stroke.  Patient also fell during the past weekend after losing balance. In the emergency room she was also found to be hypoxic and had repeat CT chest PE protocol was done.  There is no PE, redemonstration of the right perihilar mass encasing the right mainstem bronchus and the right pulmonary artery with severe narrowing of the right upper lobe artery.  There is also slight increasing size of the mass.  There is multiple nodular pulmonary metastases within both lungs.  There is also consolidation of the posterior right upper lobe concerning for pneumonia.  Patient was admitted to the regular floor for stroke workup.  Overnight she was found to have atrial fibrillation so she was sent to telemetry floor. This morning patient was found to be somnolent, altered mental status not following instructions.  She just came back from a repeat MRI. She is very drowsy and not arousable. Family members are at bedside.  Review of systems- Review of Systems  Constitutional: Negative for chills and fever.  Cannot obtain review of systems due to altered mental status.  Allergies  Allergen Reactions  . Sertaconazole Other (See Comments)  .  Statins Other (See Comments)    myalgias    Patient Active Problem List   Diagnosis Date Noted  . Acute ischemic stroke ([place]) 08/12/2017  . Iron deficiency anemia 08/12/2017  . Goals of care, counseling/discussion 08/12/2017  . Lung cancer ([place]) 08/12/2017  . Pernicious anemia 05/02/2017  . Asthma without status asthmaticus 12/31/2013  . Diabetes mellitus, type 2 ([place]) 12/31/2013  . HLD (hyperlipidemia) 12/31/2013  . BP (high blood pressure) 12/31/2013  . Adiposity 12/31/2013  . Spinal stenosis 12/31/2013     Past Medical History:  Diagnosis Date  . Asthma   . Cancer ([place])   . Diabetes mellitus without complication ([place])   . Hypertension   . Iron deficiency anemia 08/12/2017  . Lung nodule 08/09/2017   Per PETscan order     Past Surgical History:  Procedure Laterality Date  . FLEXIBLE BRONCHOSCOPY N/A 07/27/2017   Procedure: FLEXIBLE BRONCHOSCOPY;  Surgeon: Wilhelmina Mcardle, MD;  Location: [facility] ORS;  Service: Pulmonary;  Laterality: N/A;  . OOPHORECTOMY Right     Social History   Socioeconomic History  . Marital status: Married    Spouse name: Not on file  . Number of children: Not on file  . Years of education: Not on file  . Highest education level: Not on file  Social Needs  . Financial resource strain: Not on file  . Food insecurity - worry: Not on file  . Food insecurity - inability: Not on file  . Transportation needs - medical: Not on file  . Transportation needs - non-medical: Not on file  Occupational  History  . Not on file  Tobacco Use  . Smoking status: Never Smoker  . Smokeless tobacco: Never Used  Substance and Sexual Activity  . Alcohol use: No    Frequency: Never  . Drug use: No  . Sexual activity: Not on file  Other Topics Concern  . Not on file  Social History Narrative  . Not on file     Family History  Problem Relation Age of Onset  . Breast cancer Mother        19's  . Breast cancer Maternal Aunt   . Ovarian cancer Maternal  Aunt   . Colon cancer Sister   . Rectal cancer Brother   . Prostate cancer Brother      Current Facility-Administered Medications:  .  0.9 %  sodium chloride infusion, , Intravenous, Continuous, Epifanio Lesches, MD, Last Rate: 50 mL/hr at 08/14/17 0026 .  acetaminophen (TYLENOL) tablet 650 mg, 650 mg, Oral, Q4H PRN **OR** acetaminophen (TYLENOL) solution 650 mg, 650 mg, Per Tube, Q4H PRN **OR** acetaminophen (TYLENOL) suppository 650 mg, 650 mg, Rectal, Q4H PRN, Vianne Bulls, [place], MD .  albuterol (PROVENTIL) (2.5 MG/3ML) 0.083% nebulizer solution 3 mL, 3 mL, Inhalation, Q6H PRN, Epifanio Lesches, MD .  aspirin EC tablet 81 mg, 81 mg, Oral, Daily, Epifanio Lesches, MD .  carvedilol (COREG) tablet 6.25 mg, 6.25 mg, Oral, BID WC, Epifanio Lesches, MD, 6.25 mg at 08/21/2017 1550 .  cholecalciferol (VITAMIN D) tablet 1,000 Units, 1,000 Units, Oral, Daily, Epifanio Lesches, MD, 1,000 Units at 08/14/2017 1538 .  citalopram (CELEXA) tablet 20 mg, 20 mg, Oral, Daily, Epifanio Lesches, MD, 20 mg at 08/21/2017 1538 .  diltiazem (CARDIZEM) 100 mg in dextrose 5 % 100 mL (1 mg/mL) infusion, 5-15 mg/hr, Intravenous, Titrated, Salary, Montell D, MD, Last Rate: 5 mL/hr at 08/14/17 0615, 5 mg/hr at 08/14/17 0615 .  enoxaparin (LOVENOX) injection 40 mg, 40 mg, Subcutaneous, Q12H, Epifanio Lesches, MD, 40 mg at 08/24/2017 2000 .  fluticasone (FLONASE) 50 MCG/ACT nasal spray 1 spray, 1 spray, Each Nare, QHS, Epifanio Lesches, MD, 1 spray at 08/18/2017 2000 .  losartan (COZAAR) tablet 100 mg, 100 mg, Oral, Daily, 100 mg at 08/25/2017 1538 **AND** hydrochlorothiazide (HYDRODIURIL) tablet 25 mg, 25 mg, Oral, Daily, Epifanio Lesches, MD, 25 mg at 07/28/2017 1537 .  levofloxacin (LEVAQUIN) IVPB 500 mg, 500 mg, Intravenous, Q24H, Epifanio Lesches, MD, Stopped at 08/06/2017 1649 .  LORazepam (ATIVAN) tablet 0.5 mg, 0.5 mg, Oral, TID PRN, Epifanio Lesches, MD .  megestrol (MEGACE) tablet 80  mg, 80 mg, Oral, BID, Epifanio Lesches, MD, 80 mg at 08/04/2017 2001 .  simvastatin (ZOCOR) tablet 10 mg, 10 mg, Oral, QHS, Epifanio Lesches, MD, 10 mg at 07/27/2017 2001 .  vitamin B-12 (CYANOCOBALAMIN) tablet 1,000 mcg, 1,000 mcg, Oral, Daily, Epifanio Lesches, MD, 1,000 mcg at 08/12/2017 1538   Physical exam:  Vitals:   08/14/17 1030 08/14/17 1100 08/14/17 1130 08/14/17 1200  BP: (!) 100/58 (!) 117/56 (!) 116/53 (!) 107/43  Pulse: 72 82 69 74  Resp:      Temp:      TempSrc:      SpO2:      Weight:      Height:       GENERAL: drowsy, lethargic EYES: no pallor or icterus NECK: supple, LYMPH:  no palpable lymphadenopathy in the cervical, axillary or inguinal regions LUNGS: Decreased breath sounds bilaterally.  No wheezing or crackles. HEART/CVS: no murmurs; trace lower extremity edema ABDOMEN: abdomen soft, non-tender  Musculoskeletal:no cyanosis of digits and no clubbing  PSYCH: Lethargic NEURO: Not following verbal orders. SKIN:  no rashes or significant lesions     CMP Latest Ref Rng & Units 08/12/2017  Glucose 65 - 99 mg/dL 150(H)  BUN 6 - 20 mg/dL 16  Creatinine 0.44 - 1.00 mg/dL 0.99  Sodium 135 - 145 mmol/L 130(L)  Potassium 3.5 - 5.1 mmol/L 3.3(L)  Chloride 101 - 111 mmol/L 86(L)  CO2 22 - 32 mmol/L 31  Calcium 8.9 - 10.3 mg/dL 9.2  Total Protein 6.5 - 8.1 g/dL 7.5  Total Bilirubin 0.3 - 1.2 mg/dL 1.0  Alkaline Phos 38 - 126 U/L 73  AST 15 - 41 U/L 19  ALT 14 - 54 U/L 15   CBC Latest Ref Rng & Units 08/12/2017  WBC 3.6 - 11.0 K/uL 26.9(H)  Hemoglobin 12.0 - 16.0 g/dL 11.4(L)  Hematocrit 35.0 - 47.0 % 35.2  Platelets 150 - 440 K/uL 340     Dg Chest 2 View  Result Date: 08/12/2017 CLINICAL DATA:  Shortness of breath and hypoxia. EXAM: CHEST - 2 VIEW COMPARISON:  CT and PET scan FINDINGS: Heart size is normal. Large right hilar and suprahilar mass with right upper lobe volume loss. Left lung is clear. Small amount of pleural fluid on the right. No  acute bone finding. IMPRESSION: Similar appearance to the previous studies. Right hilar and suprahilar mass with right upper lobe volume loss and small amount of pleural fluid on the right. Electronically Signed   By: Nelson Chimes M.D.   On: 08/16/2017 13:45   Ct Chest W Contrast  Result Date: 07/24/2017 CLINICAL DATA:  Questionable mass in the lung on outside x-ray, some back pain, some shortness of breath EXAM: CT CHEST WITH CONTRAST TECHNIQUE: Multidetector CT imaging of the chest was performed during intravenous contrast administration. CONTRAST:  31m ISOVUE-300 IOPAMIDOL (ISOVUE-300) INJECTION 61% COMPARISON:  None. FINDINGS: Cardiovascular: The heart is mildly enlarged. No pericardial effusion is seen. Mild to moderate abdominal aortic atherosclerosis is present. Mid ascending thoracic aorta measures 36 mm in diameter. Mediastinum/Nodes: There is significantly enlarged mediastinal adenopathy present. On image 53 series 2 a massive nodes has diameter of 21 mm. A node in the precarinal region to the right of midline measures 23 mm on image 56. A right infrahilar node on image 78 measures 16 mm in short axis diameter. There are a few scattered small low-attenuation thyroid nodules present of doubtful significance. Lungs/Pleura: There is a large right hilar and suprahilar mass present extending into the right upper lobe with some involvement of the upper aspect of the right middle lobe. This lesion also appears to extend through the anterior pleura into the medial right chest wall, best seen on image 41. The right suprahilar mass on image 61 measures 43 x 63 mm. 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 This lesion appears to compress and probably directly invade the superior vena cava, and possibly the right main pulmonary artery. Also, the right mainstem bronchus is truncated, being surrounded by this right suprahilar mass. There is a vague opacity peripherally in the right upper lobe on  image 76 series 3 which could be due to mild volume loss with no solid component evident. However a more solid-appearing nodular lesion is present within the right lower lobe on image 82 measuring 23 mm suspicious for ipsilateral metastatic lesion. A small nodular opacity in the right lower lobe on image 104 could represent a metastatic lesion is well. A  6 mm noncalcified nodular lesion subpleural in the left lower lobe posteriorly on image 135 is suspicious for contralateral metastatic lesion. No other definite lung nodule or mass is seen. No pleural effusion is noted. Upper Abdomen: A small soft tissue nodule is noted posterior to the right lobe of liver on image 154 which could represent a metastasis. No definite hepatic lesion is seen. Multiple gallstones layer within the gallbladder. Bilateral adrenal lesions are present. On the right right adrenal mass measures 2.8 x 2.2 cm, and the left adrenal lesion measures 2.5 x 4.9 cm, both of which are suspicious for bilateral adrenal metastases. Musculoskeletal: On bone window images, the sternum appears somewhat inhomogeneous and metastasis involving the sternum cannot be excluded. However, on coronal images this finding involving the sternum is not confirmed. No metastatic lesion involving the thoracic spine is seen. IMPRESSION: 1. Large right hilar-suprahilar mass extending into the right upper lobe and superior aspect of the right middle lobe consistent with primary lung carcinoma. 2. This right hilar mass appears to directly invade the adjacent superior vena cava and possibly the right main pulmonary artery, as well as the anterior right chest wall medially. 3. Suspect small bilateral lung metastases. 4. Mediastinal and right hilar adenopathy. 5. 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 6. Multiple gallstones. Electronically Signed   By: Ivar Drape M.D.   On: 07/24/2017 15:46   Ct Angio Chest Pe W/cm &/or Wo Cm  Result Date:  07/30/2017 CLINICAL DATA:  Shortness of breath and weakness.  Lung cancer. EXAM: CT ANGIOGRAPHY CHEST WITH CONTRAST TECHNIQUE: Multidetector CT imaging of the chest was performed using the standard protocol during bolus administration of intravenous contrast. Multiplanar CT image reconstructions and MIPs were obtained to evaluate the vascular anatomy. CONTRAST:  126m ISOVUE-370 IOPAMIDOL (ISOVUE-370) INJECTION 76% COMPARISON:  PET CT 08/09/2017 FINDINGS: Cardiovascular: Contrast injection is sufficient to demonstrate satisfactory opacification of the pulmonary arteries to the segmental level. There is no pulmonary embolus. The main pulmonary artery is mildly enlarged, measuring 3.2 cm. There is a normal 3-vessel arch branching pattern without evidence of acute aortic syndrome. There is mildaortic atherosclerosis. Heart size is normal, without pericardial effusion. Mediastinum/Nodes: Multiple right paratracheal nodes measuring up to 1.9 cm. No axillary lymphadenopathy. Lungs/Pleura: Confluent right parahilar mass encases the right mainstem bronchus and the proximal aspect of the right upper lobe bronchus and bronchus intermedius. The size of the mass measures 7.5 x 5.6 cm, previously 7.0 x 5.0 cm. The right pulmonary artery is also encased. There is severe narrowing of the right upper lobar artery. There is a region of confluent opacity in the posterior right upper lobe measuring up to 7 cm. Small right pleural effusion. Pulmonary nodules within the right lung measure up to 9 x 6 mm. Nodules in the left lung measure up to 10 x 11 mm. Upper Abdomen: Contrast bolus timing is not optimized for evaluation of the abdominal organs. Right adrenal mass measures 2.8 x 2.2 cm, but is incompletely visualized. Incompletely visualized left adrenal mass measures up to 5.6 x 4.2 cm. The remainder of the visible upper abdominal organs are normal. Musculoskeletal: Suspected osseous metastases are better characterized on the recent  PET CT. There are no large lytic or blastic lesions. Review of the MIP images confirms the above findings. IMPRESSION: 1. No pulmonary embolus or acute aortic syndrome. 2. Large right parahilar mass encasing the right mainstem bronchus and right pulmonary artery with severe narrowing of the right upper lobar artery. Slight  increase in size of the mass may be due to slice selection. 3. Multiple nodular pulmonary metastases within both lungs. 4. Area of consolidation in the posterior right upper lobe, concerning for pneumonia. 5. Bilateral adrenal metastases, left-greater-than-right. 6. Osseous metastases suggested on the PET CT are not visible on this scan. 7.  Aortic Atherosclerosis (ICD10-I70.0). Electronically Signed   By: Ulyses Jarred M.D.   On: 08/06/2017 14:48   Mr Brain Wo Contrast  Result Date: 08/20/2017 CLINICAL DATA:  Recently diagnosed lung cancer. IV contrast was not administered due to diminished renal function (GFR of 29). EXAM: MRI HEAD WITHOUT CONTRAST TECHNIQUE: Multiplanar, multiecho pulse sequences of the brain and surrounding structures were obtained without intravenous contrast. COMPARISON:  None. FINDINGS: Brain: There is a 5 mm focus of restricted diffusion in the subcortical white matter of the posteromedial right frontal lobe (series 5, image 35) with mild T2/FLAIR hyperintensity suggestive of an acute/early acute small vessel infarct. No acute infarct is identified elsewhere. No intracranial mass lesion or vasogenic edema is identified to suggest metastatic disease, however lack of IV contrast reduces sensitivity for detection of small lesions. Patchy T2 hyperintensities in the subcortical and deep cerebral white matter and pons are nonspecific but compatible with mild-to-moderate chronic small vessel ischemic disease. 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 The ventricles and sulci are normal for age. Vascular: Major intracranial  vascular flow voids are preserved. Skull and upper cervical spine: No suspicious marrow lesion. Sinuses/Orbits: Bilateral cataract extraction. Complex left maxillary sinus fluid. Clear mastoid air cells. Other: None. IMPRESSION: 1. No definite evidence of intracranial metastases on this unenhanced examination. Short-term follow-up brain MRI with contrast is recommended when the patient's renal function improves. 2. 5 mm focus of restricted diffusion in the right frontal lobe most consistent with acute/early subacute infarct. 3. Mild-to-moderate chronic small vessel ischemic disease. Electronically Signed   By: Logan Bores M.D.   On: 08/25/2017 10:52   US Carotid Bilateral (at [facility] And [facility] Only)  Result Date: 08/14/2017 CLINICAL DATA:  77 year old female with acute right frontal lobe mini-stroke. EXAM: BILATERAL CAROTID DUPLEX ULTRASOUND TECHNIQUE: Pearline Cables scale imaging, color Doppler and duplex ultrasound were performed of bilateral carotid and vertebral arteries in the neck. COMPARISON:  Brain MRI 07/29/2017 FINDINGS: Criteria: Quantification of carotid stenosis is based on velocity parameters that correlate the residual internal carotid diameter with NASCET-based stenosis levels, using the diameter of the distal internal carotid lumen as the denominator for stenosis measurement. The following velocity measurements were obtained: RIGHT ICA:  109/53 cm/sec CCA:  315/17 cm/sec SYSTOLIC ICA/CCA RATIO:  0.8 DIASTOLIC ICA/CCA RATIO:  1.2 ECA:  135 cm/sec LEFT ICA:  141/66 cm/sec CCA:  616/07 cm/sec SYSTOLIC ICA/CCA RATIO:  1.1 DIASTOLIC ICA/CCA RATIO:  1.7 ECA:  96 cm/sec RIGHT CAROTID ARTERY: No significant atherosclerotic plaque or evidence of stenosis. RIGHT VERTEBRAL ARTERY:  Patent with normal antegrade flow. LEFT CAROTID ARTERY: Focal heterogeneous atherosclerotic plaque in the proximal internal carotid artery. By peak systolic velocity criteria, the estimated stenosis falls in the 50-69% diameter range. LEFT  VERTEBRAL ARTERY:  Patent with normal antegrade flow. IMPRESSION: 1. 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 2. No significant atherosclerotic plaque or evidence of stenosis in the right internal carotid artery. 3. The vertebral arteries are patent with normal antegrade flow. 4. Tachycardic cardiac rhythm noted on Doppler tracings. Signed, Criselda Peaches, MD [facility]  Radiology Electronically Signed   By: Jacqulynn Cadet M.D.   On: 08/14/2017 07:47   Nm Pet Image Initial (pi) Skull Base To Thigh  Result Date: 08/09/2017 CLINICAL DATA:  Initial treatment strategy for lung nodule. EXAM: NUCLEAR MEDICINE PET SKULL BASE TO THIGH TECHNIQUE: 60.3 mCi F-18 FDG was injected intravenously. Full-ring PET imaging was performed from the skull base to thigh after the radiotracer. CT data was obtained and used for attenuation correction and anatomic localization. Fasting blood glucose:  mg/dl Mediastinal blood pool activity: SUV max 4.0 COMPARISON:  Chest CT 07/24/2017 FINDINGS: NECK: No hypermetabolic lymph nodes in the neck. Incidental CT findings: none CHEST: The right hilar and suprahilar mass is markedly hypermetabolic with SUV max = 34. Mediastinal lymphadenopathy shows similar hypermetabolism. Pulmonary metastases are evident bilaterally with 9 mm left lower lobe nodule (image 122 series 3) demonstrating SUV max = 5.9. Incidental CT findings: Atherosclerotic calcification is noted in the wall of the thoracic aorta. 5.7 x 3.3 cm focus of ground-glass attenuation posterior right upper lobe is new in the interval and nonspecific. This shows no substantial hypermetabolism but could be a postobstructive process. Organizing pneumonia could also have this appearance but would likely show some hypermetabolism. ABDOMEN/PELVIS: Both adrenal lesions are hypermetabolic. 4.0 x 5.4 cm left  adrenal mass demonstrates SUV max = 26. No hypermetabolic lymphadenopathy in the abdomen. No evidence for hypermetabolic liver metastases although liver parenchyma does demonstrate a mottled pattern of FDG uptake. Incidental CT findings: Layering calcified gallstones evident. There is abdominal aortic atherosclerosis without aneurysm. Diverticular changes noted left colon. SKELETON: Scattered hypermetabolic bone metastases are evident, including posterior arch of C1, T2 vertebral body, numerous other thoracolumbar spine, rib, right scapula, bony pelvis, and left femoral lesions. Hypermetabolic focus in the muscles of the anterior right shoulder is suspicious for metastatic involvement. Incidental CT findings: none IMPRESSION: 1. Dominant right hilar and suprahilar mass extending anteriorly along the mediastinum to the anterior right pleura is markedly hypermetabolic. This is associated with hypermetabolic metastases in the mediastinum, lungs, both adrenal glands, and bones. Focal hypermetabolic uptake in the muscles of the anterior right shoulder suggests metastatic involvement. 2. Cholelithiasis. 3.  Aortic Atherosclerois (ICD10-170.0) Electronically Signed   By: Misty Stanley M.D.   On: 08/09/2017 16:44    Assessment and plan- Patient is a 77 y.o. female with newly diagnosed lung pleomorphic carcinoma, with distant metastasis involving bilateral adrenal glands, bone lesions, currently admitted due to acute stroke, A. fib, altered mental status.  #Malignant neoplasm of lung, pleomorphic carcinoma: She was supposed to start chemotherapy this week however currently admitted due to acute stroke. I had a lengthy discussion with patient and her family members.  Her stroke is likely secondary to atrial fibrillation which may be a consequence of her large perihilar lung mass.  Unfortunately her mental status also deteriorated and she just had a repeat MRI done which showed no acute bleeding or CNS involvement. If  her condition can be stabilized and mental status be improved, that might be a chance that we can treat with inpatient chemotherapy with carboplatinum and Taxol in order to achieve a quick response and decrease tumor burden.  Eventually she is a good candidate for immunotherapy given her extremely high PDL 1 status.  [facility] one testing was sent and will most likely result next week.  We will contact [facility] lab to see if we can get preliminary data to help with our decision.  If patient has targetable mutation such as EGFR, ALK,  ROS, MET mutation, may change her management plan.  .  However, I discussed with patient and family members that there is a high likelihood that patient may actually continue to decline and if that is the case no treatment can be offered.  I encourage patient's family to start talking to palliative care service.  I have already placed palliative care consult.  We will continue to follow along patient's inpatient course.  Earlie Server, MD, PhD Hematology Oncology [facility] at [facility] Pager- [phone] 08/14/2017

## 2017-08-14 NOTE — Progress Notes (Signed)
PT Cancellation Note  Patient Details Name: ADRIYANA GREENBAUM MRN: [id number] DOB: [birth year]   Cancelled Treatment:    Reason Eval/Treat Not Completed: Medical issues which prohibited therapy(Consult received and chart reviewed.  Patient with noted decline in status and recent transfer to CCU.  Per policy, will require new orders to resume services. Please re-consult as medically appropriate.)   Bernardette Waldron H. Owens Shark, PT, DPT, NCS 08/14/17, 2:32 PM [phone]

## 2017-08-14 NOTE — Progress Notes (Signed)
Call for A. fib with RVR/a flutter-we will transfer to telemetry floor, start Cardizem drip, EKG now and every a.m., consult cardiology for expert opinion

## 2017-08-14 NOTE — Consult Note (Signed)
Cardiology Consultation:   Patient ID: Carrie Marquez; [id number]; 29-Jul-1940   Admit date: 08/03/2017 Date of Consult: 08/14/2017  Primary Care Provider: Baxter Hire, MD Primary Cardiologist: new to [facility] - consult by End   Patient Profile:   Carrie Marquez is a 77 y.o. female with a hx of recently diagnosed lung cancer, DM, HTN, HLD, and obesity who is being seen today for the evaluation of new onset Afib/flutter with RVR at the request of Dr. Jerelyn Charles.  History of Present Illness:   Carrie Marquez has no previously known cardiac history. She has recently been diagnosed with lung cancer. She had insidious onset back pain for which she underwent image workup and with incidental finding of a large right lung mass. She underwent bronchoscopy with pathology showing high-grade pleomorphic carcinoma. Imaging has shown mass extends through the anterior pleura into the medial right chest wall, appears to compress/possibly invade the SVC, and possibly right main pulmonary artery, there is also a small nodular opacity in the right lower lobe, and another small lesion in the left lower lobe. Upper abdomen showed small soft tissue nodules posterior to the right lobe of liver, and bilateral adrenal lesions. She underwent screening MRI of the brain to evaluate for mets on 08/01/2017 which showed an acute/subacute infarct in the right frontal lobe measuring 5 mm. She was scheduled to receive palliative chemotherapy this week. She was sent directly to the ED. Patient and family denied any symptoms concerning for stroke. Of note, the patient did suffered what was felt to be a mechanical fall overnight on 08/12/17 leading to a black eye. Upon her arrival she was noted to have a mildly elevated troponin with a peak of 0.11. BP 676H systolic. She was noted to be in sinus tachycardia on EKG in the ED. Overnight, she developed new onset Afib/flutter with RVR with heart rates into the 140s bpm. She was started on a  Cardizem gtt for rate control. She has since converted to sinus rhythm. Echo is pending. Husband notes the patient is much more somnolent this morning and just appears "out of it."   Past Medical History:  Diagnosis Date  . Asthma   . Cancer ([place])   . Diabetes mellitus without complication ([place])   . Hypertension   . Iron deficiency anemia 08/12/2017  . Lung nodule 08/09/2017   Per PETscan order    Past Surgical History:  Procedure Laterality Date  . FLEXIBLE BRONCHOSCOPY N/A 07/27/2017   Procedure: FLEXIBLE BRONCHOSCOPY;  Surgeon: Wilhelmina Mcardle, MD;  Location: [facility] ORS;  Service: Pulmonary;  Laterality: N/A;  . OOPHORECTOMY Right      Home Meds: Prior to Admission medications   Medication Sig Start Date End Date Taking? Authorizing Provider  acetaminophen (TYLENOL) 500 MG tablet Take 1,000 mg by mouth every 6 (six) hours as needed for moderate pain or headache.   Yes [provider]  albuterol (VENTOLIN HFA) 108 (90 BASE) MCG/ACT inhaler Inhale 1 puff into the lungs every 6 (six) hours as needed for wheezing or shortness of breath.    Yes [provider]  aspirin EC 81 MG tablet Take 81 mg by mouth at bedtime.    Yes [provider]  carvedilol (COREG) 6.25 MG tablet Take 6.25 mg by mouth 2 (two) times daily with a meal.  01/05/14  Yes [provider]  cholecalciferol (VITAMIN D) 1000 units tablet Take 1,000 Units by mouth daily.   Yes [provider]  citalopram (CELEXA) 20 MG tablet Take 20 mg by mouth at bedtime.    Yes [provider]  Cyanocobalamin (RA VITAMIN B-12 TR) 1000 MCG TBCR Take 1,000 mcg by mouth daily.    Yes [provider]  fluticasone (FLONASE) 50 MCG/ACT nasal spray Place 1 spray into both nostrils at bedtime.    Yes [provider]  gabapentin (NEURONTIN) 300 MG capsule Take 300 mg by mouth at bedtime. 12/01/16  Yes [provider]  HYDROcodone-acetaminophen (NORCO/VICODIN) 5-325 MG  tablet Take 1 tablet by mouth every 6 (six) hours as needed for moderate pain. 08/02/17  Yes Earlie Server, MD  LORazepam (ATIVAN) 0.5 MG tablet Take 0.5 mg by mouth 3 (three) times daily as needed for anxiety.    Yes [provider]  losartan-hydrochlorothiazide (HYZAAR) 100-25 MG per tablet Take 1 tablet by mouth daily.  11/11/13  Yes [provider]  megestrol (MEGACE) 40 MG tablet Take 2 tablets (80 mg total) by mouth 2 (two) times daily. 08/08/17  Yes Earlie Server, MD  meloxicam (MOBIC) 15 MG tablet Take 15 mg by mouth daily as needed for pain.    Yes [provider]  ondansetron (ZOFRAN) 8 MG tablet Take 1 tablet (8 mg total) by mouth 2 (two) times daily as needed for refractory nausea / vomiting. Start on day 3 after carboplatin chemo. 08/12/17  Yes Earlie Server, MD  prochlorperazine (COMPAZINE) 10 MG tablet Take 1 tablet (10 mg total) by mouth every 6 (six) hours as needed (Nausea or vomiting). 08/12/17  Yes Earlie Server, MD  simvastatin (ZOCOR) 20 MG tablet Take 10 mg by mouth at bedtime.   Yes [provider]  lidocaine-prilocaine (EMLA) cream Apply to affected area once 08/12/17   Earlie Server, MD  potassium chloride SA (K-DUR,KLOR-CON) 20 MEQ tablet Take 1 tablet (20 mEq total) by mouth daily. Patient not taking: Reported on 08/10/2017 08/02/17   Earlie Server, MD    Inpatient Medications: Scheduled Meds: . aspirin EC  81 mg Oral Daily  . carvedilol  6.25 mg Oral BID WC  . cholecalciferol  1,000 Units Oral Daily  . citalopram  20 mg Oral Daily  . enoxaparin (LOVENOX) injection  40 mg Subcutaneous Q12H  . fluticasone  1 spray Each Nare QHS  . losartan  100 mg Oral Daily   And  . hydrochlorothiazide  25 mg Oral Daily  . megestrol  80 mg Oral BID  . simvastatin  10 mg Oral QHS  . vitamin B-12  1,000 mcg Oral Daily   Continuous Infusions: . sodium chloride 50 mL/hr at 08/14/17 0026  . diltiazem (CARDIZEM) infusion 5 mg/hr (08/14/17 0615)  . levofloxacin (LEVAQUIN) IV Stopped  (08/25/2017 1649)   PRN Meds: acetaminophen **OR** acetaminophen (TYLENOL) oral liquid 160 mg/5 mL **OR** acetaminophen, albuterol, LORazepam  Allergies:   Allergies  Allergen Reactions  . Sertaconazole Other (See Comments)  . Statins Other (See Comments)    myalgias    Social History:   Social History   Socioeconomic History  . Marital status: Married    Spouse name: Not on file  . Number of children: Not on file  . Years of education: Not on file  . Highest education level: Not on file  Social Needs  . Financial resource strain: Not on file  . Food insecurity - worry: Not on file  . Food insecurity - inability: Not on file  . Transportation needs - medical: Not on file  . Transportation needs - non-medical:  Not on file  Occupational History  . Not on file  Tobacco Use  . Smoking status: Never Smoker  . Smokeless tobacco: Never Used  Substance and Sexual Activity  . Alcohol use: No    Frequency: Never  . Drug use: No  . Sexual activity: Not on file  Other Topics Concern  . Not on file  Social History Narrative  . Not on file     Family History:   Family History  Problem Relation Age of Onset  . Breast cancer Mother        80's  . Breast cancer Maternal Aunt   . Ovarian cancer Maternal Aunt   . Colon cancer Sister   . Rectal cancer Brother   . Prostate cancer Brother     ROS:  Review of Systems  Unable to perform ROS: Mental status change      Physical Exam/Data:   Vitals:   08/14/17 0756 08/14/17 0758 08/14/17 0800 08/14/17 0830  BP: 109/61 109/61 (!) 106/37 (!) 106/48  Pulse: 70 73 71 71  Resp: 20     Temp: (!) 97.4 F (36.3 C)     TempSrc: Oral     SpO2: 96%     Weight: 289 lb 1.6 oz (131.1 kg)     Height: _0  (1.753 m)       Intake/Output Summary (Last 24 hours) at 08/14/2017 0945 Last data filed at 08/14/2017 0026 Gross per 24 hour  Intake 540.83 ml  Output -  Net 540.83 ml   Filed Weights   08/04/2017 1237 08/14/2017 1520 08/14/17  0756  Weight: 290 lb (131.5 kg) 290 lb (131.5 kg) 289 lb 1.6 oz (131.1 kg)   Body mass index is 42.69 kg/m.   Physical Exam: General: Ill appearing, in no acute distress. Head: Normocephalic, ecchymosis of the left eye, sclera non-icteric, no xanthomas, nares without discharge.  Neck: Negative for carotid bruits. JVD not elevated. Lungs: Clear bilaterally to auscultation without wheezes, rales, or rhonchi. Breathing is unlabored. Heart: RRR with S1 S2. No murmurs, rubs, or gallops appreciated. Abdomen: Soft, non-tender, non-distended with normoactive bowel sounds. No hepatomegaly. No rebound/guarding. No obvious abdominal masses. Msk:  Strength and tone appear normal for age. Extremities: No clubbing or cyanosis. No edema. Distal pedal pulses are 2+ and equal bilaterally. Neuro: Will open eyes when asked, minimally conversive. Somnolent. Not oriented to place or time. Mild facial asymmetry. Diffuse upper and lower extremity weakness. Psych:  Somnolent.   EKG:  The EKG was personally reviewed and demonstrates: sinus tachycardia, 109 bpm, left axis deviation, baseline wandering, poor R wave progression, nonspecific st/t changes Telemetry:  Telemetry was personally reviewed and demonstrates: currently in sinus rhythm, developed Afib/flutter with RVR overnight  Weights: Filed Weights   08/03/2017 1237 08/20/2017 1520 08/14/17 0756  Weight: 290 lb (131.5 kg) 290 lb (131.5 kg) 289 lb 1.6 oz (131.1 kg)    Relevant CV Studies: TTE pending  Laboratory Data:  Chemistry Recent Labs  Lab 08/10/17 1603 08/10/2017 1133 08/11/2017 1245  NA 133* 130* 130*  K 3.1* 3.4* 3.3*  CL 89* 86* 86*  CO2 _1 GLUCOSE 120* 168* 150*  BUN _2 CREATININE 1.69* 0.98 0.99  CALCIUM 8.9 9.2 9.2  GFRNONAA 28* 55* 54*  GFRAA 33* >60 >60  ANIONGAP _3 Recent Labs  Lab 08/10/17 1603 08/10/2017 1133 08/12/2017 1245  PROT 7.1 7.3 7.5  ALBUMIN 3.1* 2.8* 2.9*  AST 19  18 19  ALT 13* 14 15    ALKPHOS 68 71 73  BILITOT 0.6 0.9 1.0   Hematology Recent Labs  Lab 08/10/17 1603 08/12/2017 1133 07/27/2017 1245  WBC 20.1* 24.5* 26.9*  RBC 4.27 4.31 4.54  HGB 10.6* 10.9* 11.4*  HCT 33.2* 33.6* 35.2  MCV 77.7* 78.0* 77.5*  MCH 24.8* 25.2* 25.0*  MCHC 31.9* 32.3 32.2  RDW 18.4* 18.2* 18.4*  PLT 345 332 340   Cardiac Enzymes Recent Labs  Lab 07/28/2017 1245 08/22/2017 1548 08/24/2017 2109 08/14/17 0322  TROPONINI 0.13* 0.10* 0.08* 0.10*   No results for input(s): TROPIPOC in the last 168 hours.  BNPNo results for input(s): BNP, PROBNP in the last 168 hours.  DDimer No results for input(s): DDIMER in the last 168 hours.  Radiology/Studies:  Dg Chest 2 View  Result Date: 08/16/2017 IMPRESSION: Similar appearance to the previous studies. Right hilar and suprahilar mass with right upper lobe volume loss and small amount of pleural fluid on the right. Electronically Signed   By: Nelson Chimes M.D.   On: 08/26/2017 13:45   Ct Angio Chest Pe W/cm &/or Wo Cm  Result Date: 08/12/2017 IMPRESSION: 1. No pulmonary embolus or acute aortic syndrome. 2. Large right parahilar mass encasing the right mainstem bronchus and right pulmonary artery with severe narrowing of the right upper lobar artery. Slight increase in size of the mass may be due to slice selection. 3. Multiple nodular pulmonary metastases within both lungs. 4. Area of consolidation in the posterior right upper lobe, concerning for pneumonia. 5. Bilateral adrenal metastases, left-greater-than-right. 6. Osseous metastases suggested on the PET CT are not visible on this scan. 7.  Aortic Atherosclerosis (ICD10-I70.0). Electronically Signed   By: Ulyses Jarred M.D.   On: 08/01/2017 14:48   Mr Brain Wo Contrast  Result Date: 08/06/2017 IMPRESSION: 1. No definite evidence of intracranial metastases on this unenhanced examination. Short-term follow-up brain MRI with contrast is recommended when the patient's renal function improves. 2. 5 mm  focus of restricted diffusion in the right frontal lobe most consistent with acute/early subacute infarct. 3. Mild-to-moderate chronic small vessel ischemic disease. Electronically Signed   By: Logan Bores M.D.   On: 08/26/2017 10:52   US Carotid Bilateral (at [facility] And Ap Only)  Result Date: 08/14/2017 IMPRESSION: 1. 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 2. No significant atherosclerotic plaque or evidence of stenosis in the right internal carotid artery. 3. The vertebral arteries are patent with normal antegrade flow. 4. Tachycardic cardiac rhythm noted on Doppler tracings. Signed, Criselda Peaches, MD [facility] Electronically Signed   By: Jacqulynn Cadet M.D.   On: 08/14/2017 07:47    Assessment and Plan:   1. New onset Afib/flutter with RVR: -Currently in sinus rhythm -Wean Cardizem gtt -Echo pending -Rate control with Coreg -CHADS2VASc at least 7(HTN, age x 2, DM, stroke x 2, female) -Would benefit from long term, full-dose anticoagulation, though given recent stroke, will defer this at this time until cleared by neurology -Check TSH -Replete potassium to goal > 4.0 -Check magnesium  2. Elevated troponin: -Never complained of chest pain -Likely elevated in the setting of her stroke -Echo pending  3. Stroke: -Likely secondary to #1 -Cardiology discussed with IM and recommended neurology see the patient as she is more somnolent this morning  -May need repeat imaging -Carotid ultrasound as above -Would allow for permissive HTN  4. Lung  cancer: -May be beneficial to have oncology follow the patient as well to weigh in on prognosis  5. Probable pulmonary hypertension: -Likely in the setting of her lung cancer -Echo as above   6. Hypokalemia: -Replete to goal > 4.0  7. Hyponatremia: -Possibly in the setting of her cancer -Per  IM  8. Hypoalbuminemia: -Likely in the setting of the above -Megace  -Per IM  9. DM: -Per IM  10. Leukocytosis: -Per IM  11. Anemia: -Per IM  12. Mechanical fall: -PT   For questions or updates, please contact [place] Please consult [email or web address] for contact info under Cardiology/STEMI.   Signed, Christell Faith, PA-C [facility] Pager: [phone] 08/14/2017, 9:45 AM

## 2017-08-14 NOTE — Significant Event (Signed)
Rapid Response Event Note  Overview: Time Called: 1251 Arrival Time: 1253 Event Type: Other (Comment)(pt unresonsive)  Initial Focused Assessment: Pt minimally responsive to verbal and tactile stimulation. Pt diaphoretic, breathing was agonal, BP 90/44, HR 62, O2 94 on 6L.   Interventions: RR monitors applied to pt, vitals obtained, ABG obtained  Plan of Care (if not transferred): ABG: PpH 7.08 and CO2 >120 Pt transferred to ICU for bipap by RRRN, AC, RT, floor RN  Event Summary: Name of Physician Notified: Moody at 1252    at    Outcome: Transferred (Comment)(Transf ICU 2)  Event End Time: [place]

## 2017-08-15 ENCOUNTER — Inpatient Hospital Stay: Payer: Medicare PPO

## 2017-08-15 ENCOUNTER — Inpatient Hospital Stay
Admit: 2017-08-15 | Discharge: 2017-08-15 | Disposition: A | Discharge status: Home / Self Care | Payer: Medicare PPO | Attending: Internal Medicine | Admitting: Internal Medicine

## 2017-08-15 ENCOUNTER — Telehealth: Payer: Self-pay | Admitting: *Deleted

## 2017-08-15 ENCOUNTER — Encounter: Admission: EM | Disposition: E | Payer: Self-pay | Source: Home / Self Care | Attending: Internal Medicine

## 2017-08-15 DIAGNOSIS — C3490 Malignant neoplasm of unspecified part of unspecified bronchus or lung: Secondary | ICD-10-CM

## 2017-08-15 DIAGNOSIS — Z515 Encounter for palliative care: Secondary | ICD-10-CM

## 2017-08-15 DIAGNOSIS — I639 Cerebral infarction, unspecified: Principal | ICD-10-CM

## 2017-08-15 DIAGNOSIS — I4892 Unspecified atrial flutter: Secondary | ICD-10-CM

## 2017-08-15 DIAGNOSIS — R748 Abnormal levels of other serum enzymes: Secondary | ICD-10-CM

## 2017-08-15 DIAGNOSIS — Z7189 Other specified counseling: Secondary | ICD-10-CM

## 2017-08-15 LAB — CBC
HCT: 34.1 % — ABNORMAL LOW (ref 35.0–47.0)
Hemoglobin: 10.4 g/dL — ABNORMAL LOW (ref 12.0–16.0)
MCH: 24.9 pg — ABNORMAL LOW (ref 26.0–34.0)
MCHC: 30.4 g/dL — AB (ref 32.0–36.0)
MCV: 81.9 fL (ref 80.0–100.0)
Platelets: 366 10*3/uL (ref 150–440)
RBC: 4.17 MIL/uL (ref 3.80–5.20)
RDW: 18.7 % — AB (ref 11.5–14.5)
WBC: 35.5 10*3/uL — ABNORMAL HIGH (ref 3.6–11.0)

## 2017-08-15 LAB — PROCALCITONIN: Procalcitonin: 0.62 ng/mL

## 2017-08-15 LAB — COMPREHENSIVE METABOLIC PANEL
ALK PHOS: 59 U/L (ref 38–126)
ALT: 12 U/L — ABNORMAL LOW (ref 14–54)
ANION GAP: 6 (ref 5–15)
AST: 14 U/L — ABNORMAL LOW (ref 15–41)
Albumin: 2.3 g/dL — ABNORMAL LOW (ref 3.5–5.0)
BILIRUBIN TOTAL: 0.5 mg/dL (ref 0.3–1.2)
BUN: 27 mg/dL — ABNORMAL HIGH (ref 6–20)
CALCIUM: 7.7 mg/dL — AB (ref 8.9–10.3)
CO2: 35 mmol/L — ABNORMAL HIGH (ref 22–32)
Chloride: 96 mmol/L — ABNORMAL LOW (ref 101–111)
Creatinine, Ser: 1.98 mg/dL — ABNORMAL HIGH (ref 0.44–1.00)
GFR calc Af Amer: 27 mL/min — ABNORMAL LOW (ref >60)
GFR calc non Af Amer: 24 mL/min — ABNORMAL LOW (ref >60)
GLUCOSE: 177 mg/dL — AB (ref 65–99)
Potassium: 4.9 mmol/L (ref 3.5–5.1)
Sodium: 137 mmol/L (ref 135–145)
TOTAL PROTEIN: 6.3 g/dL — AB (ref 6.5–8.1)

## 2017-08-15 LAB — ECHOCARDIOGRAM COMPLETE
Height: 69 in
Weight: 4634.95 oz

## 2017-08-15 LAB — TSH: TSH: 1.965 u[IU]/mL (ref 0.350–4.500)

## 2017-08-15 LAB — PHOSPHORUS: Phosphorus: 5.4 mg/dL — ABNORMAL HIGH (ref 2.5–4.6)

## 2017-08-15 LAB — MAGNESIUM: MAGNESIUM: 1.7 mg/dL (ref 1.7–2.4)

## 2017-08-15 SURGERY — PORTA CATH INSERTION
Anesthesia: Moderate Sedation

## 2017-08-15 MED ORDER — GLYCOPYRROLATE 0.2 MG/ML IJ SOLN
0.2000 mg | INTRAMUSCULAR | Status: DC | PRN
  Filled 2017-08-15: qty 1

## 2017-08-15 MED ORDER — LORAZEPAM 2 MG/ML IJ SOLN: 1.0000 mg | INTRAMUSCULAR | Status: DC | PRN

## 2017-08-15 MED ORDER — MORPHINE 100MG IN NS 100ML (1MG/ML) PREMIX INFUSION
1.0000 mg/h | INTRAVENOUS | Status: DC
  Administered 2017-08-15: 1 mg/h via INTRAVENOUS
  Filled 2017-08-15: qty 100

## 2017-08-15 MED ORDER — SODIUM CHLORIDE 0.9 % IV SOLN
INTRAVENOUS | Status: DC
  Administered 2017-08-15: 10:00:00 via INTRAVENOUS

## 2017-08-15 MED ORDER — DEXTROSE-NACL 5-0.45 % IV SOLN: INTRAVENOUS | Status: DC

## 2017-08-15 MED ORDER — MORPHINE SULFATE (PF) 2 MG/ML IV SOLN: 2.0000 mg | INTRAVENOUS | Status: DC | PRN

## 2017-08-16 NOTE — Progress Notes (Signed)
Note opened in error.Lula Olszewski Oncology Research Assistant 08/16/2017 2:20 PM

## 2017-08-16 NOTE — Progress Notes (Signed)
I contacted [facility] to assure there were no reporting requirements for study due to patient death.  Malachi Bonds with [employer] informed that because the study is not a human use study (the study is pre-clinical), there are no formal reporting requirement.   Lula Olszewski Oncology Research Assistant 08/16/2017 2:19 PM

## 2017-08-17 ENCOUNTER — Ambulatory Visit: Payer: Medicare PPO | Admitting: Oncology

## 2017-08-17 ENCOUNTER — Other Ambulatory Visit: Payer: Medicare PPO

## 2017-08-17 ENCOUNTER — Ambulatory Visit: Payer: Medicare PPO | Admitting: Radiation Oncology

## 2017-08-17 ENCOUNTER — Ambulatory Visit: Payer: Medicare PPO

## 2017-08-17 ENCOUNTER — Telehealth: Payer: Self-pay

## 2017-08-17 NOTE — Telephone Encounter (Signed)
Funeral home informed death cert is ready for pick up.

## 2017-08-17 NOTE — Telephone Encounter (Signed)
Placed in DK's folder to be signed.

## 2017-08-17 NOTE — Telephone Encounter (Signed)
Received Death certificate placed in nurse box

## 2017-08-24 ENCOUNTER — Other Ambulatory Visit: Payer: Self-pay | Admitting: Nurse Practitioner

## 2017-08-27 NOTE — Progress Notes (Signed)
I know Carrie Marquez well as I recently performed bronchoscopy on her.  I evaluated her this morning and found her on vasopressors and BiPAP.  She was minimally responsive.  I reviewed all of her medical records and concur with the assessment of Dr. Mortimer Fries that her prognosis for meaningful survival was exceedingly small.  I spoke with the family in detail discussing options of care.  We were all in favor of proceeding with full comfort care.  A morphine infusion was initiated.  BiPAP and phenylephrine were discontinued.  She passed away peacefully shortly thereafter.  Merton Border, MD PCCM service Mobile [phone] Pager [phone] 09-04-17 12:48 PM

## 2017-08-27 NOTE — Progress Notes (Addendum)
Daily Progress Note   Patient Name: Carrie Marquez       Date: 08/21/17 DOB: [birth year]  Age: 77 y.o. MRN#: [id number] Attending Physician: Carrie Marquez, * Primary Care Physician: Carrie Hire, MD Admit Date: 08/12/2017  Reason for Consultation/Follow-up: Establishing goals of care  Subjective: Patient resting in bed with gaze to ceiling. Family at bedside. Patient made comfort care by CCM team this morning, awaiting more family to come to bedside. Support offered to family. Ativan ordered for anxiety.  Robinul ordered for excessive secretions.  Shortly after placing orders for medications, patient was noted to have shallow and infrequent breathing.  Support offered. Patient expired.   Length of Stay: 2  Current Medications: Scheduled Meds:    Continuous Infusions: . sodium chloride 50 mL/hr at 21-Aug-2017 0956  . morphine 1 mg/hr (21-Aug-2017 0956)  . phenylephrine (NEO-SYNEPHRINE) Adult infusion Stopped (08-21-17 0957)    PRN Meds: [DISCONTINUED] acetaminophen **OR** [DISCONTINUED] acetaminophen (TYLENOL) oral liquid 160 mg/5 mL **OR** acetaminophen, albuterol, LORazepam, morphine injection  Physical Exam  Constitutional: No distress.  Pulmonary/Chest:  No distress noted.             Vital Signs: BP (!) 102/41   Pulse 99   Temp 97.9 F (36.6 C) (Axillary)   Resp 19   Ht 5\' 9"  (1.753 m)   Wt 131.4 kg (289 lb 11 oz)   SpO2 97%   BMI 42.78 kg/m  SpO2: SpO2: 97 % O2 Device: O2 Device: Bi-PAP O2 Flow Rate: O2 Flow Rate (L/min): 3 L/min  Intake/output summary:   Intake/Output Summary (Last 24 hours) at 2017/08/21 1100 Last data filed at 08/21/17 0400 Gross per 24 hour  Intake 6642.58 ml  Output 650 ml  Net 5992.58 ml   LBM: Last BM Date:  08/08/2017 Baseline Weight: Weight: 131.5 kg (290 lb) Most recent weight: Weight: 131.4 kg (289 lb 11 oz)       Palliative Assessment/Data: 10%      Patient Active Problem List   Diagnosis Date Noted  . Atrial flutter ([place])   . Paroxysmal atrial fibrillation ([facility])   . Demand ischemia ([place])   . Cerebrovascular accident (CVA) ([place])   . Malignant neoplasm of lung ([place])   . Acute ischemic stroke ([place]) 08/12/2017  . Iron deficiency anemia 08/12/2017  . Goals of  care, counseling/discussion 08/12/2017  . Primary malignant neoplasm of right lung metastatic to other site ([place]) 08/12/2017  . Pernicious anemia 05/02/2017  . Asthma without status asthmaticus 12/31/2013  . Diabetes mellitus, type 2 ([place]) 12/31/2013  . HLD (hyperlipidemia) 12/31/2013  . BP (high blood pressure) 12/31/2013  . Adiposity 12/31/2013  . 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 Patient had MRI of the brain to look for metastasis but it showed acute right frontal stroke.   Assessment/Recommendations/Plan:  Comfort care by CCM today, patient expired.    Code Status:    Code Status Orders  (From admission, onward)        Start     Ordered   08/14/17 1441  Do not attempt resuscitation (DNR)  Continuous    Question Answer Comment  In the event of cardiac or respiratory ARREST Do not call a "code blue"   In the event of cardiac or respiratory ARREST Do not perform Intubation, CPR, defibrillation or ACLS   In the event of cardiac or respiratory ARREST Use medication by any route, position, wound care, and other measures to relive pain and suffering. May use oxygen, suction and manual treatment of airway obstruction as needed for comfort.   Comments AS PER CONVERSATION WITH DR Carrie Marquez       08/14/17 1440    Code Status History    Date Active Date Inactive Code Status Order ID Comments User Context   08/24/2017 14:23 08/14/2017 14:40 Full Code [id number]  Carrie Lesches, MD ED    Advance Directive Documentation     Most Recent Value  Type of Advance Directive  Healthcare Power of Attorney, Living will  Pre-existing out of facility DNR order (yellow form or pink MOST form)  No data  "MOST" Form in Place?  No data       Prognosis:  Expired  Discharge Planning:  Anticipated Hospital Death  Care plan was discussed with RN  Thank you for allowing the Palliative Medicine Team to assist in the care of this patient.   IN 10:30- 11:35  Total Time 65 min Prolonged Time Billed yes      Greater than 50%  of this time was spent counseling and coordinating care related to the above assessment and plan.  Carrie Gowda, NP  Please contact Palliative Medicine Team phone at [phone] for questions and concerns.

## 2017-08-27 NOTE — Discharge Summary (Signed)
DEATH SUMMARY  DATE OF ADMISSION: 09/06/2017  DATE OF DISCHARGE/DEATH: 08-Sep-2017  ADMISSION DIAGNOSES:   Acute CVA Hypoxemia Tachycardia Anorexia Hypokalemia Hyponatremia Elevated troponin I Recent diagnosis of lung cancer Suspected postobstructive pneumonia   DISCHARGE DIAGNOSES:   Acute CVA Hypoxemia Tachycardia Anorexia Hypokalemia Hyponatremia Elevated troponin I Recent diagnosis of lung cancer, stage IV Suspected postobstructive pneumonia Metabolic acidosis Acute encephalopathy Acute kidney injury New onset atrial fibrillation with rapid ventricular response Type 2 diabetes Leukocytosis Recent fall  PRESENTATION:   Pt was admitted to the hospitalist service with the following HPI and the above admission diagnoses:  Carrie Marquez  is a 77 y.o. 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  Patient had MRI of the brain to look for metastasis but it showed acute right frontal stroke.  Patient denies any complaints except that she had a fall last night and she felt dizzy.  Patient has no weakness, no slurred speech.  Follows up with Dr. 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  Supposed to get palliative chemotherapy.  Pertinent to husband patient is not eating or drinking anything recently.  Started on Megace by Dr. Tasia Catchings.  Patient also has shortness of breath, right now she is on 3 L of oxygen and sats are around 99%.  HOSPITAL COURSE:   Admission on Sep 06, 2017 to hospitalist service as documented above.   Multiple consultations performed including cardiology, neurology, palliative care, oncology, critical care medicine. CTA of chest performed Sep 06, 2017 revealed no pulmonary embolism, large right perihilar mass encasing the right mainstem bronchus and right pulmonary artery, multiple nodular pulmonary metastases  within both lungs, consolidation of posterior segment of right upper lobe suggesting postobstructive pneumonia, bilateral adrenal metastases.   MRI of brain performed on 09/06/17 revealed no definite evidence of intracranial metastases, 5 mm focus of restricted diffusion in right frontal lobe most consistent with acute/early subacute infarct.  Repeat MRI of brain 08/14/17 revealed stable focal area of restricted diffusion involving the posterior medial right frontal lobe measuring less than 10 mm and no other acute findings.   Echocardiogram performed 09/08/2017 revealed LVEF 60-65%.  She was transferred to the ICU 08/14/17 with respiratory distress, depressed level of consciousness, hypotension, metabolic acidosis, AKI, hyperkalemia.  She was supported with noninvasive ventilation and phenylephrine.  DNR was established.  On the morning of September 08, 2017, further discussions were undertaken regarding goals of care.  Family opted for full comfort care.   Cause of death:  Metastatic lung cancer  Contributing factors: Acute CVA, acute hypoxemic respiratory failure, acute onset atrial fibrillation, acute kidney injury  Autopsy:  No  Smoking:  No  Merton Border, MD PCCM service Mobile [phone] Pager [phone] 08-Sep-2017 1:00 PM

## 2017-08-27 NOTE — Telephone Encounter (Signed)
[employer] testing has been cancelled at this time.

## 2017-08-27 NOTE — Progress Notes (Signed)
*  PRELIMINARY RESULTS* Echocardiogram 2D Echocardiogram has been performed.  Carrie Marquez 09/01/17, 8:19 AM

## 2017-08-27 NOTE — Progress Notes (Signed)
Progress Note  Patient Name: Carrie Marquez Date of Encounter: 09-10-17  Primary Cardiologist: No primary care provider on file.   Subjective   Patient without complaints today.  She is more awake per her husband and currently on BiPAP.  Yesterday afternoon, she was transferred to the ICU due to somnolence and multiorgan dysfunction.  She is currently on a morphine infusion.  Inpatient Medications    Scheduled Meds:  Continuous Infusions: . sodium chloride 50 mL/hr at 10-Sep-2017 0956  . morphine 1 mg/hr (2017-09-10 0956)  . phenylephrine (NEO-SYNEPHRINE) Adult infusion Stopped (09/10/17 0957)   PRN Meds: [DISCONTINUED] acetaminophen **OR** [DISCONTINUED] acetaminophen (TYLENOL) oral liquid 160 mg/5 mL **OR** acetaminophen, albuterol, morphine injection   Vital Signs    Vitals:   2017-09-10 0700 Sep 10, 2017 0730 09-10-2017 0800 2017-09-10 0830  BP: (!) 109/42 (!) 111/48 (!) 97/52 (!) 102/41  Pulse: 99 98 87 99  Resp: (!) 25 19 20 19   Temp:  97.9 F (36.6 C)    TempSrc:  Axillary    SpO2: 99% 96% 95% 97%  Weight:      Height:        Intake/Output Summary (Last 24 hours) at 10-Sep-2017 1036 Last data filed at 09/10/2017 0400 Gross per 24 hour  Intake 6642.58 ml  Output 650 ml  Net 5992.58 ml   Filed Weights   08/11/2017 1520 08/14/17 0756 08/14/17 1500  Weight: 290 lb (131.5 kg) 289 lb 1.6 oz (131.1 kg) 289 lb 11 oz (131.4 kg)    Telemetry    Atrial flutter with variable conduction- Personally Reviewed  ECG   No new tracing available.  Physical Exam   GEN:  Obese woman, lying in bed with BiPAP mask on.  Her husband is at the bedside. Neck:  Unable to assess JVP due to patient positioning and straps from BiPAP mask. Cardiac:  Irregularly irregular without murmurs. Respiratory:  Diminished breath sounds anteriorly. GI: Soft, nontender, non-distended  MS:  1+ lower extremity edema. Neuro:   Somnolent but arousable.  Labs    Chemistry Recent Labs  Lab  08/16/2017 1133 08/18/2017 1245 08/14/17 1458 2017-09-10 0651  NA 130* 130* 135 137  K 3.4* 3.3* 6.1* 4.9  CL 86* 86* 94* 96*  CO2 31 31 33* 35*  GLUCOSE 168* 150* 159* 177*  BUN 16 16 24* 27*  CREATININE 0.98 0.99 1.87* 1.98*  CALCIUM 9.2 9.2 8.9 7.7*  PROT 7.3 7.5  --  6.3*  ALBUMIN 2.8* 2.9*  --  2.3*  AST 18 19  --  14*  ALT 14 15  --  12*  ALKPHOS 71 73  --  59  BILITOT 0.9 1.0  --  0.5  GFRNONAA 55* 54* 25* 24*  GFRAA >60 >60 29* 27*  ANIONGAP 13 13 8 6      Hematology Recent Labs  Lab 08/24/2017 1245 08/14/17 1458 09/10/2017 0651  WBC 26.9* 32.0* 35.5*  RBC 4.54 4.28 4.17  HGB 11.4* 10.6* 10.4*  HCT 35.2 34.6* 34.1*  MCV 77.5* 80.9 81.9  MCH 25.0* 24.8* 24.9*  MCHC 32.2 30.6* 30.4*  RDW 18.4* 18.8* 18.7*  PLT 340 435 366    Cardiac Enzymes Recent Labs  Lab 08/04/2017 1245 07/28/2017 1548 08/12/2017 2109 08/14/17 0322  TROPONINI 0.13* 0.10* 0.08* 0.10*   No results for input(s): TROPIPOC in the last 168 hours.   BNPNo results for input(s): BNP, PROBNP in the last 168 hours.   DDimer No results for input(s): DDIMER in the last 168  hours.   Radiology    Dg Chest 2 View  Result Date: 08/02/2017 CLINICAL DATA:  Shortness of breath and hypoxia. EXAM: CHEST - 2 VIEW COMPARISON:  CT and PET scan FINDINGS: Heart size is normal. Large right hilar and suprahilar mass with right upper lobe volume loss. Left lung is clear. Small amount of pleural fluid on the right. No acute bone finding. IMPRESSION: Similar appearance to the previous studies. Right hilar and suprahilar mass with right upper lobe volume loss and small amount of pleural fluid on the right. Electronically Signed   By: Nelson Chimes M.D.   On: 07/31/2017 13:45   Ct Angio Chest Pe W/cm &/or Wo Cm  Result Date: 08/12/2017 CLINICAL DATA:  Shortness of breath and weakness.  Lung cancer. EXAM: CT ANGIOGRAPHY CHEST WITH CONTRAST TECHNIQUE: Multidetector CT imaging of the chest was performed using the standard protocol  during bolus administration of intravenous contrast. Multiplanar CT image reconstructions and MIPs were obtained to evaluate the vascular anatomy. CONTRAST:  162mL ISOVUE-370 IOPAMIDOL (ISOVUE-370) INJECTION 76% COMPARISON:  PET CT 08/09/2017 FINDINGS: Cardiovascular: Contrast injection is sufficient to demonstrate satisfactory opacification of the pulmonary arteries to the segmental level. There is no pulmonary embolus. The main pulmonary artery is mildly enlarged, measuring 3.2 cm. There is a normal 3-vessel arch branching pattern without evidence of acute aortic syndrome. There is mildaortic atherosclerosis. Heart size is normal, without pericardial effusion. Mediastinum/Nodes: Multiple right paratracheal nodes measuring up to 1.9 cm. No axillary lymphadenopathy. Lungs/Pleura: Confluent right parahilar mass encases the right mainstem bronchus and the proximal aspect of the right upper lobe bronchus and bronchus intermedius. The size of the mass measures 7.5 x 5.6 cm, previously 7.0 x 5.0 cm. The right pulmonary artery is also encased. There is severe narrowing of the right upper lobar artery. There is a region of confluent opacity in the posterior right upper lobe measuring up to 7 cm. Small right pleural effusion. Pulmonary nodules within the right lung measure up to 9 x 6 mm. Nodules in the left lung measure up to 10 x 11 mm. Upper Abdomen: Contrast bolus timing is not optimized for evaluation of the abdominal organs. Right adrenal mass measures 2.8 x 2.2 cm, but is incompletely visualized. Incompletely visualized left adrenal mass measures up to 5.6 x 4.2 cm. The remainder of the visible upper abdominal organs are normal. Musculoskeletal: Suspected osseous metastases are better characterized on the recent PET CT. There are no large lytic or blastic lesions. Review of the MIP images confirms the above findings. IMPRESSION: 1. No pulmonary embolus or acute aortic syndrome. 2. Large right parahilar mass encasing  the right mainstem bronchus and right pulmonary artery with severe narrowing of the right upper lobar artery. Slight increase in size of the mass may be due to slice selection. 3. Multiple nodular pulmonary metastases within both lungs. 4. Area of consolidation in the posterior right upper lobe, concerning for pneumonia. 5. Bilateral adrenal metastases, left-greater-than-right. 6. Osseous metastases suggested on the PET CT are not visible on this scan. 7.  Aortic Atherosclerosis (ICD10-I70.0). Electronically Signed   By: Ulyses Jarred M.D.   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 Abnormal MRI brain 08/13/2016. EXAM: MRI HEAD WITHOUT CONTRAST TECHNIQUE: Multiplanar, multiecho pulse sequences of the brain and surrounding structures were obtained without intravenous contrast. COMPARISON:  MRI brain 08/13/2016. FINDINGS: Brain: A focal area of restricted  diffusion is again noted within the posteromedial right frontal lobe along the cingulate gyrus. There is no significant interval change. No additional lesions are present. There is increased patient motion on today's scan compared to the scan from yesterday. Remote lacunar infarcts are present in the basal ganglia. Periventricular and subcortical T2 changes bilaterally are stable. White matter changes extend into the brainstem cerebellum is unremarkable. Vascular: Normal flow voids. Skull and upper cervical spine: The skull base is within normal limits. The craniocervical junction is normal. Decreased marrow signal in the upper cervical spine is concerning for metastatic disease. Sinuses/Orbits: A fluid level is present in the left maxillary sinus. A remote left orbital blowout fracture is present. Paranasal sinuses are otherwise clear. Globes and orbits are otherwise within normal limits. Bilateral lens replacements are noted. IMPRESSION: 1. Stable focal  area of restricted diffusion involving the posteromedial right frontal lobe measuring less than 10 mm. Findings are compatible with acute/subacute infarct involving the SMA. Neurologic sequela may be difficult to detect. 2. Stable atrophy and advanced white matter disease. This likely reflects the sequela of chronic microvascular ischemia. 3. No other new or acute lesions to suggest metastatic disease to the brain. Electronically Signed   By: San Morelle M.D.   On: 08/14/2017 13:18   US Carotid Bilateral (at [facility] And [facility] Only)  Result Date: 08/14/2017 CLINICAL DATA:  77 year old female with acute right frontal lobe mini-stroke. EXAM: BILATERAL CAROTID DUPLEX ULTRASOUND TECHNIQUE: Pearline Cables scale imaging, color Doppler and duplex ultrasound were performed of bilateral carotid and vertebral arteries in the neck. COMPARISON:  Brain MRI 08/25/2017 FINDINGS: Criteria: Quantification of carotid stenosis is based on velocity parameters that correlate the residual internal carotid diameter with NASCET-based stenosis levels, using the diameter of the distal internal carotid lumen as the denominator for stenosis measurement. The following velocity measurements were obtained: RIGHT ICA:  109/53 cm/sec CCA:  440/10 cm/sec SYSTOLIC ICA/CCA RATIO:  0.8 DIASTOLIC ICA/CCA RATIO:  1.2 ECA:  135 cm/sec LEFT ICA:  141/66 cm/sec CCA:  272/53 cm/sec SYSTOLIC ICA/CCA RATIO:  1.1 DIASTOLIC ICA/CCA RATIO:  1.7 ECA:  96 cm/sec RIGHT CAROTID ARTERY: No significant atherosclerotic plaque or evidence of stenosis. RIGHT VERTEBRAL ARTERY:  Patent with normal antegrade flow. LEFT CAROTID ARTERY: Focal heterogeneous atherosclerotic plaque in the proximal internal carotid artery. By peak systolic velocity criteria, the estimated stenosis falls in the 50-69% diameter range. LEFT VERTEBRAL ARTERY:  Patent with normal antegrade flow. IMPRESSION: 1. Moderate (50-69%) stenosis proximal left internal carotid artery secondary to focal heterogeneous  atherosclerotic plaque. 2. No significant atherosclerotic plaque or evidence of stenosis in the right internal carotid artery. 3. The vertebral arteries are patent with normal antegrade flow. 4. Tachycardic cardiac rhythm noted on Doppler tracings. Signed, Criselda Peaches, MD [facility] Radiology Electronically Signed   By: Jacqulynn Cadet M.D.   On: 08/14/2017 07:47    Cardiac Studies   Echocardiogram pending  Patient Profile     77 y.o. female with a hx of recently diagnosed lung cancer, DM, HTN, HLD, and obesity, whom we are following due to atrial flutter in the setting of recent acute/subacute stroke.  Assessment & Plan    Atrial flutter Patient is back in atrial flutter today though ventricular rate is reasonably well controlled.  Given soft blood pressures requiring phenylephrine,, she is not on any AV nodal blocking agents or anticoagulation at this time.  No AV nodal blocking agents at this time given reasonable heart rate control.  Consider anticoagulation given the recent stroke and atrial flutter, if this is in accordance with the patient's goals of care.  Follow-up transthoracic echocardiogram  For questions or updates, please contact [facility] Please consult [email or web address] for contact info under Cardiology/STEMI.   Signed, Nelva Bush, MD  09-05-17, 10:36 AM

## 2017-08-27 DEATH — deceased

## 2017-09-03 ENCOUNTER — Telehealth: Payer: Self-pay | Admitting: Oncology

## 2017-09-03 NOTE — Telephone Encounter (Signed)
Patient's daughter Florentina Jenny called and want to talk to me.  Called [facility]. She expresses her appreciation of the care her mother received. She asked about patient's gene testing results and wonder if lung cancer gene will be passed to family members.  Explained to her that [employer] [facility] testing was cancelled after her mother expired. And these are somatic mutations not germline mutation testing. Patient voices understanding.  She also asks pathology reports. Advise patient to call medical records and request records.

## 2017-09-19 LAB — BLOOD GAS, ARTERIAL
ACID-BASE EXCESS: UNDETERMINED mmol/L (ref 0.0–2.0)
Bicarbonate: UNDETERMINED mmol/L (ref 20.0–28.0)
FIO2: 0.36
O2 SAT: UNDETERMINED %
PATIENT TEMPERATURE: 37
pO2, Arterial: 71 mmHg — ABNORMAL LOW (ref 83.0–108.0)

## 2019-04-08 IMAGING — MR MR HEAD W/O CM
9 of 10 series · 39 of 48 positions shown · non-contrast
Comparison: MRI brain 08/13/2016.

CLINICAL DATA: Lung cancer with known metastatic disease to the
adrenal glands. Abnormal MRI brain 08/13/2016.

EXAM:
MRI HEAD WITHOUT CONTRAST
TECHNIQUE: Multiplanar, multiecho pulse sequences of the brain and surrounding
structures were obtained without intravenous contrast.

[Series 2: GRE · sagittal · 5.0mm · 0.45mm/px · 3 of 27 slices shown (1 of 2)]
[im 1/27]
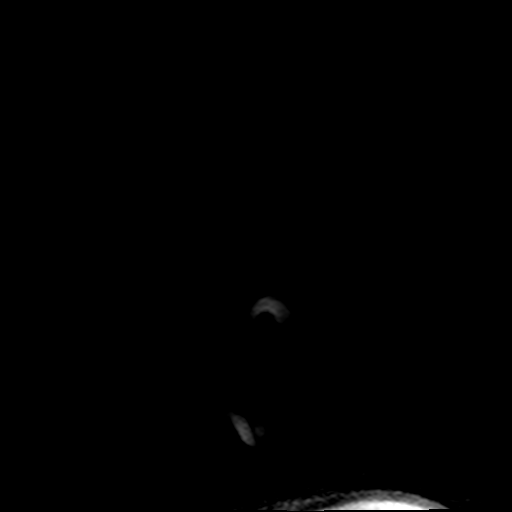
[im 14/27]
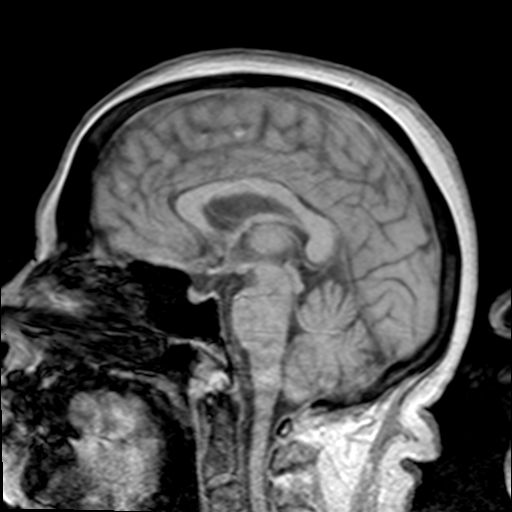
[im 27/27]
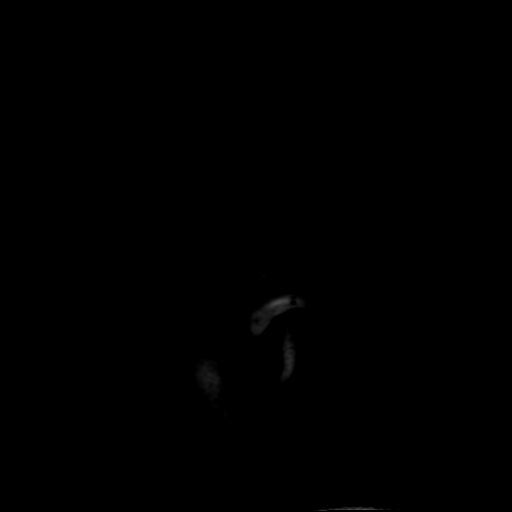

[Series 4: DWI · axial · 3.0mm · 1.80mm/px · z∈[-83,+74]mm · 7 of 55 slices shown (1 of 2)]
[im 1/55]
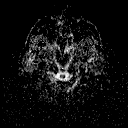
[im 10/55]
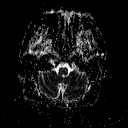
[im 19/55]
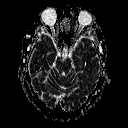
[im 28/55]
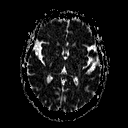
[im 37/55]
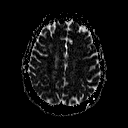
[im 46/55]
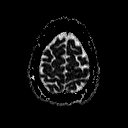
[im 55/55]
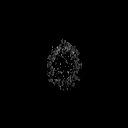

[Series 6: DWI · coronal · 3.0mm · 1.80mm/px · 6 of 49 slices shown (2 of 2)]
[im 1/49]
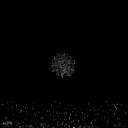
[im 10/49]
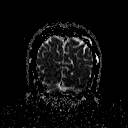
[im 20/49]
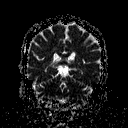
[im 29/49]
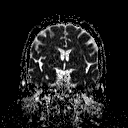
[im 39/49]
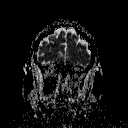
[im 49/49]
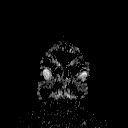

[Series 7: T2 · axial · 5.0mm · 0.45mm/px · z∈[-75,+76]mm · 3 of 25 slices shown (1 of 3)]
[im 1/25]
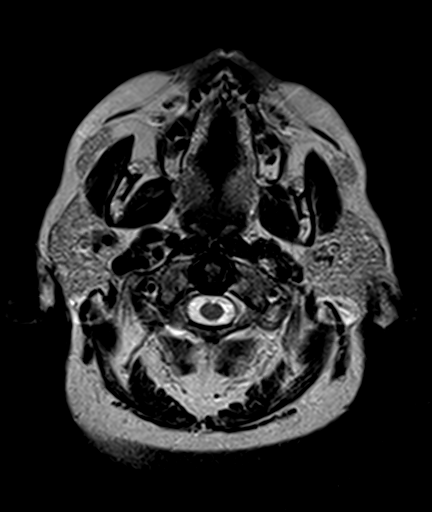
[im 13/25]
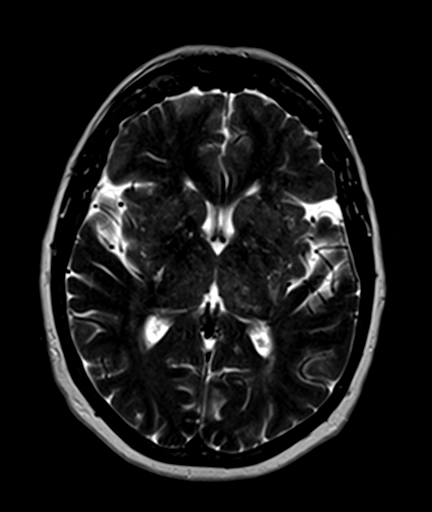
[im 25/25]
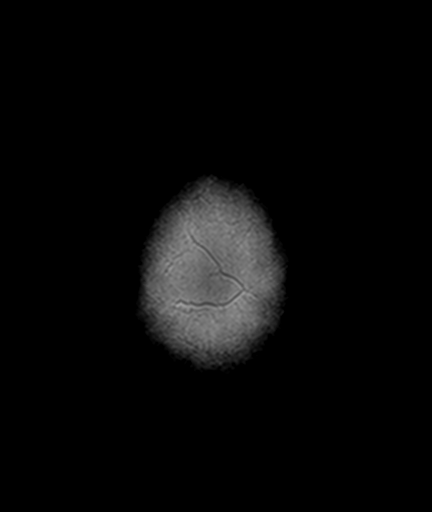

[Series 8: FLAIR · axial · 3.0mm · 0.45mm/px · z∈[-75,+76]mm · 7 of 53 slices shown]
[im 1/53]
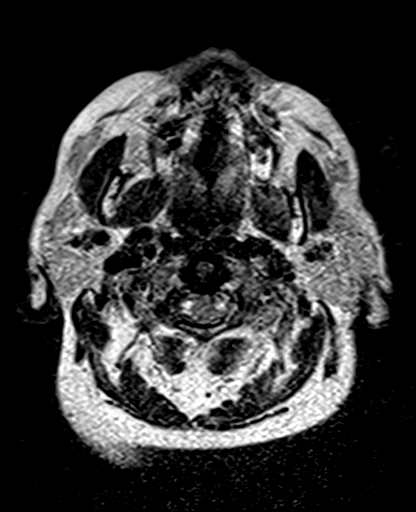
[im 9/53]
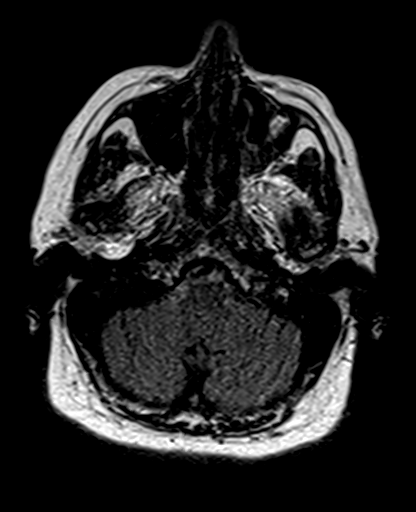
[im 18/53]
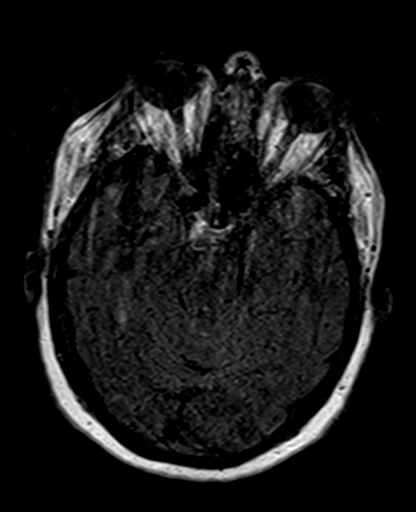
[im 27/53]
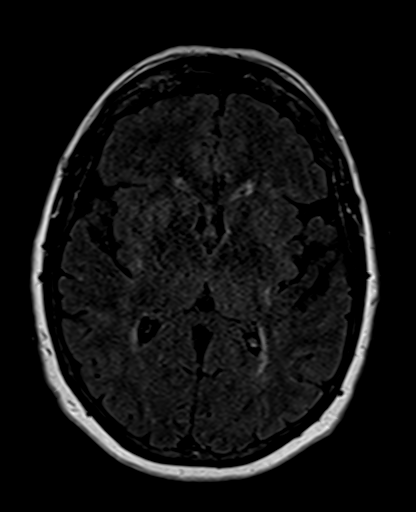
[im 35/53]
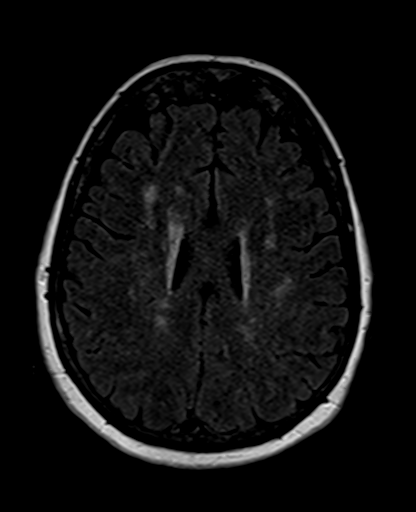
[im 44/53]
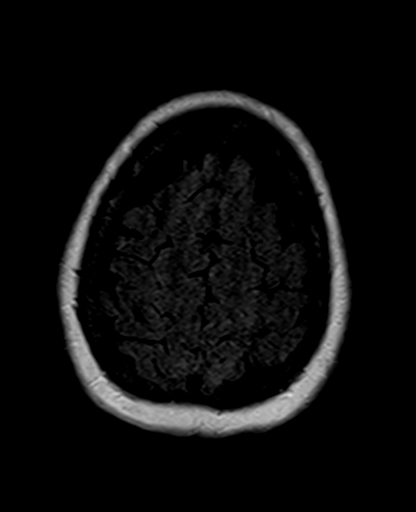
[im 53/53]
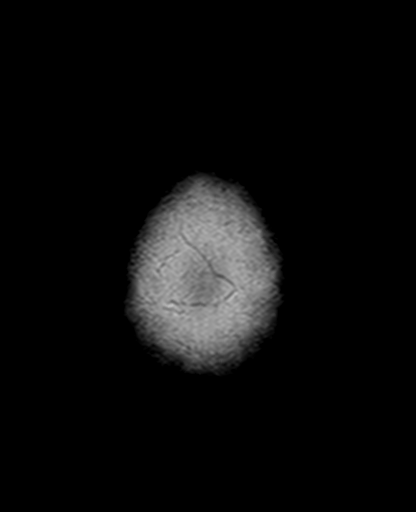

[Series 9: T2 · axial · 5.0mm · 1.20mm/px · z∈[-74,+76]mm · 3 of 25 slices shown (2 of 3)]
[im 1/25]
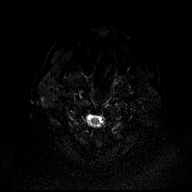
[im 13/25]
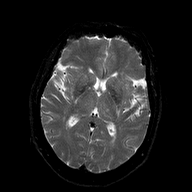
[im 25/25]
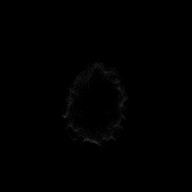

[Series 10: GRE · axial · 5.0mm · 0.45mm/px · z∈[-74,+76]mm · 3 of 25 slices shown (2 of 2)]
[im 1/25]
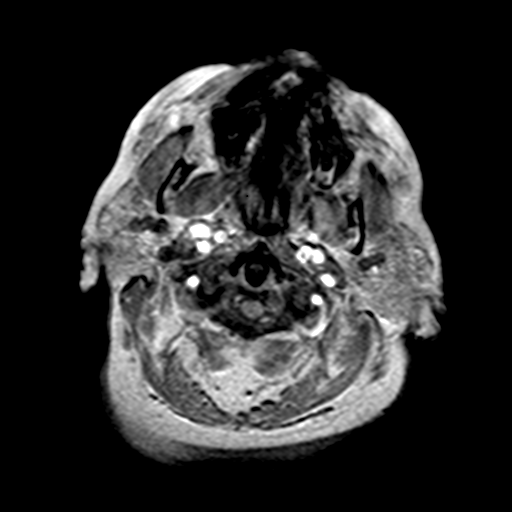
[im 13/25]
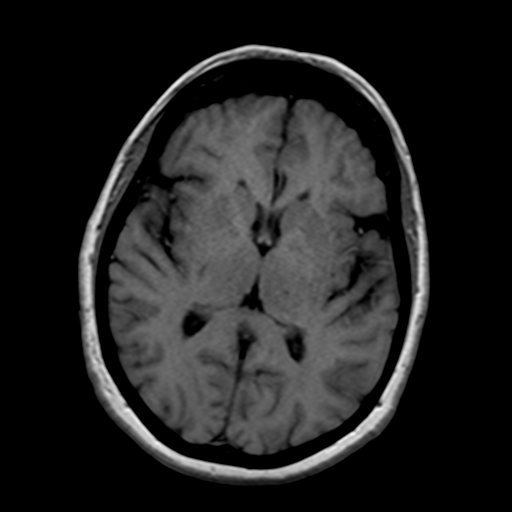
[im 25/25]
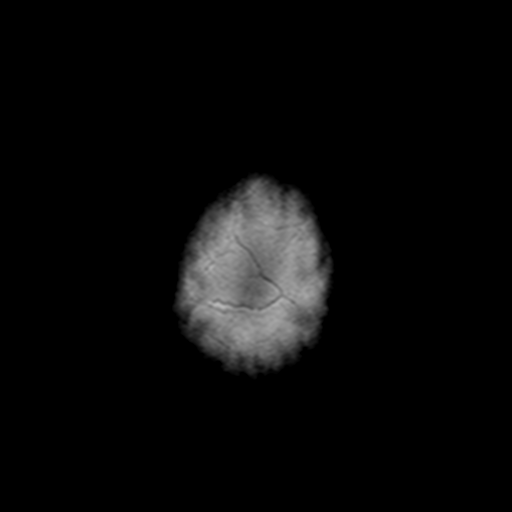

[Series 11: T2 · coronal · 5.0mm · 0.45mm/px · 3 of 27 slices shown (3 of 3)]
[im 1/27]
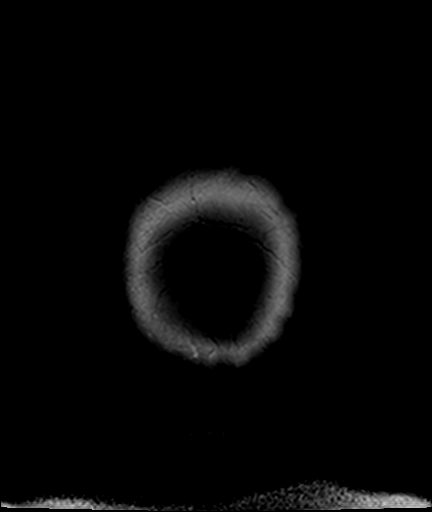
[im 14/27]
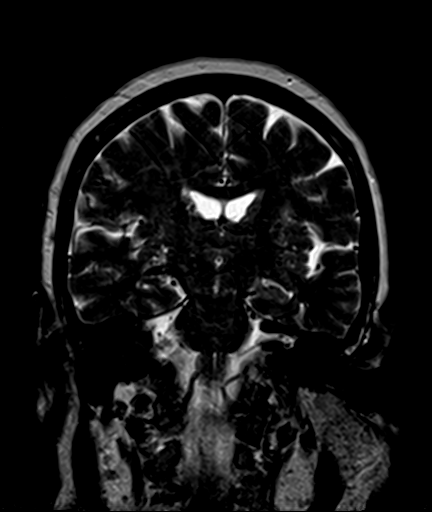
[im 27/27]
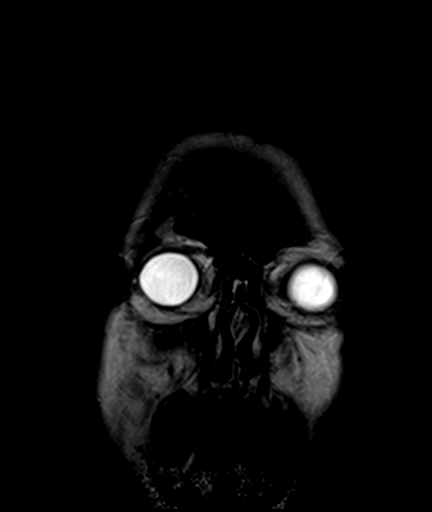

[Series 100: (id) · axial · 3.0mm · 1.80mm/px · z∈[-83,-4]mm · 4 of 55 slices shown]
[im 1/55]
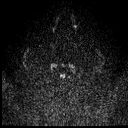
[im 10/55]
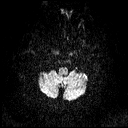
[im 19/55]
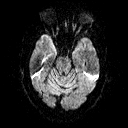
[im 28/55]
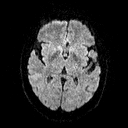

[39 of 48 positions shown; findings below may reference images not displayed]

FINDINGS: Brain: A focal area of restricted diffusion is again noted within
the posteromedial right frontal lobe along the cingulate gyrus.
There is no significant interval change. No additional lesions are
present. There is increased patient motion on today's scan compared
to the scan from yesterday. Remote lacunar infarcts are present in
the basal ganglia. Periventricular and subcortical T2 changes
bilaterally are stable. White matter changes extend into the
brainstem cerebellum is unremarkable.

Vascular: Normal flow voids.

Skull and upper cervical spine: The skull base is within normal
limits. The craniocervical junction is normal. Decreased marrow
signal in the upper cervical spine is concerning for metastatic
disease.

Sinuses/Orbits: A fluid level is present in the left maxillary
sinus. A remote left orbital blowout fracture is present. Paranasal
sinuses are otherwise clear. Globes and orbits are otherwise within
normal limits. Bilateral lens replacements are noted.
IMPRESSION: 1. Stable focal area of restricted diffusion involving the
posteromedial right frontal lobe measuring less than 10 mm. Findings
are compatible with acute/subacute infarct involving the SMA.
Neurologic sequela may be difficult to detect.
2. Stable atrophy and advanced white matter disease. This likely
reflects the sequela of chronic microvascular ischemia.
3. No other new or acute lesions to suggest metastatic disease to
the brain.
